# Patient Record
Sex: Female | Born: 1980 | Race: Black or African American | Hispanic: No | Marital: Single | Smoking: Never smoker
Health system: Southern US, Community
[De-identification: ages and names within clinical notes are randomized; demographics above are authoritative.]

## PROBLEM LIST (undated history)

## (undated) DIAGNOSIS — K219 Gastro-esophageal reflux disease without esophagitis: Secondary | ICD-10-CM

## (undated) DIAGNOSIS — J45909 Unspecified asthma, uncomplicated: Secondary | ICD-10-CM

## (undated) HISTORY — DX: Gastro-esophageal reflux disease without esophagitis: K21.9

---

## 2012-02-05 ENCOUNTER — Ambulatory Visit: Payer: BC Managed Care – PPO | Admitting: Family Medicine

## 2012-02-05 VITALS — BP 120/78 | HR 82 | Temp 98.4°F | Resp 16 | Ht 68.5 in | Wt 191.8 lb

## 2012-02-05 DIAGNOSIS — L218 Other seborrheic dermatitis: Secondary | ICD-10-CM

## 2012-02-05 DIAGNOSIS — B368 Other specified superficial mycoses: Secondary | ICD-10-CM

## 2012-02-05 DIAGNOSIS — B360 Pityriasis versicolor: Secondary | ICD-10-CM

## 2012-02-05 DIAGNOSIS — L219 Seborrheic dermatitis, unspecified: Secondary | ICD-10-CM

## 2012-02-05 MED ORDER — SELENIUM SULFIDE 2.5 % EX LOTN: TOPICAL_LOTION | Freq: Every day | CUTANEOUS | Status: DC

## 2012-02-05 MED ORDER — FLUCONAZOLE 200 MG PO TABS: 400.0000 mg | ORAL_TABLET | Freq: Once | ORAL | Status: DC

## 2012-02-05 NOTE — Patient Instructions (Addendum)
Tinea Versicolor  Tinea versicolor is a common yeast infection of the skin. This condition becomes known when the yeast on our skin starts to overgrow (yeast is a normal inhabitant on our skin). This condition is noticed as white or light brown patches on brown skin, and is more evident in the summer on tanned skin. These areas are slightly scaly if scratched. The light patches from the yeast become evident when the yeast creates "holes in your suntan". This is most often noticed in the summer. The patches are usually located on the chest, back, pubis, neck and body folds. However, it may occur on any area of body. Mild itching and inflammation (redness or soreness) may be present.  DIAGNOSIS   The diagnosisof this is made clinically (by looking). Cultures from samples are usually not needed. Examination under the microscope may help. However, yeast is normally found on skin. The diagnosis still remains clinical. Examination under Wood's Ultraviolet Light can determine the extent of the infection.  TREATMENT   This common infection is usually only of cosmetic (only a concern to your appearance). It is easily treated with dandruff shampoo used during showers or bathing. Vigorous scrubbing will eliminate the yeast over several days time. The light areas in your skin may remain for weeks or months after the infection is cured unless your skin is exposed to sunlight. The lighter or darker spots caused by the fungus that remain after complete treatment are not a sign of treatment failure; it will take a long time to resolve. Your caregiver may recommend a number of commercial preparations or medication by mouth if home care is not working. Recurrence is common and preventative medication may be necessary.  This skin condition is not highly contagious. Special care is not needed to protect close friends and family members. Normal hygiene is usually enough. Follow up is required only if you develop complications (such as a  secondary infection from scratching), if recommended by your caregiver, or if no relief is obtained from the preparations used.  Document Released: 01/04/2000 Document Revised: 03/31/2011 Document Reviewed: 02/16/2008  ExitCare Patient Information 2013 [employer].

## 2012-02-05 NOTE — Progress Notes (Signed)
  Subjective:    Patient ID: Holly Kemp, female    DOB: 09-23-1980, 32 y.o.   MRN: [id number]  HPI  Feels like she can feel things crawling all over her and is itching everywhere.  She tried otc lice shampoo last night but woke up with similar sxs.  The itching started a wk ago on her head and worst there.  Did see white spots in head yest on hair and near roots.  Did get new sheets, had an air mattress, new pillow, clothes and towel that were unused for a while.  Does not have any lesions other than some light patches on back.  Past Medical History  Diagnosis Date  . GERD (gastroesophageal reflux disease)    No current outpatient prescriptions on file prior to visit.   No Known Allergies History   Social History  . Marital Status: Single    Spouse Name: N/A    Number of Children: N/A  . Years of Education: N/A   Social History Main Topics  . Smoking status: Never Smoker   . Smokeless tobacco: None  . Alcohol Use: No  . Drug Use: No  . Sexually Active: No   Other Topics Concern  . None   Social History Narrative  . None  lives a lone    Review of Systems  Constitutional: Negative for fever and chills.  HENT: Negative for facial swelling and trouble swallowing.   Eyes: Negative for discharge, redness and itching.  Skin: Positive for color change and rash. Negative for pallor and wound.       Scalp and entire body pruritic  Hematological: Negative for adenopathy. Does not bruise/bleed easily.  Psychiatric/Behavioral: Positive for sleep disturbance.      BP 120/78  Pulse 82  Temp 98.4 F (36.9 C) (Oral)  Resp 16  Ht 5' 8.5" (1.74 m)  Wt 191 lb 12.8 oz (87 kg)  BMI 28.74 kg/m2  SpO2 95%  LMP 12/06/2011 Objective:   Physical Exam  Constitutional: She is oriented to person, place, and time. She appears well-developed and well-nourished. No distress.  HENT:  Head: Normocephalic and atraumatic.  Right Ear: External ear normal.  Eyes: Conjunctivae normal are  normal. No scleral icterus.  Pulmonary/Chest: Effort normal.  Neurological: She is alert and oriented to person, place, and time.  Skin: Skin is warm and dry. Rash noted. Rash is macular. She is not diaphoretic. No erythema.       Small amount of dandruff with small white flakes at base of hair noted. In center of back diffuse hypopigmented patches noted.  Psychiatric: She has a normal mood and affect. Her behavior is normal.      Assessment & Plan:   1. Seborrheic dermatitis of scalp  selenium sulfide (SELSUN) 2.5 % shampoo - wash with daily x  1 wk all over body and hair  2. Tinea versicolor  fluconazole (DIFLUCAN) 400 MG tablet po x 1  RTC if sxs cont

## 2012-02-17 ENCOUNTER — Ambulatory Visit: Payer: BC Managed Care – PPO | Admitting: Family Medicine

## 2012-02-17 ENCOUNTER — Other Ambulatory Visit: Payer: Self-pay

## 2012-02-17 VITALS — BP 115/75 | HR 78 | Temp 98.0°F | Resp 18 | Ht 68.5 in | Wt 190.6 lb

## 2012-02-17 DIAGNOSIS — L299 Pruritus, unspecified: Secondary | ICD-10-CM

## 2012-02-17 DIAGNOSIS — L309 Dermatitis, unspecified: Secondary | ICD-10-CM

## 2012-02-17 DIAGNOSIS — L0291 Cutaneous abscess, unspecified: Secondary | ICD-10-CM

## 2012-02-17 MED ORDER — LORATADINE 10 MG PO TABS: 10.0000 mg | ORAL_TABLET | Freq: Every day | ORAL | Status: DC

## 2012-02-17 MED ORDER — TRIAMCINOLONE ACETONIDE 0.025 % EX LOTN: TOPICAL_LOTION | CUTANEOUS | Status: DC

## 2012-02-17 NOTE — Patient Instructions (Addendum)
Use a tiny bit of the triamcinolone lotion once daily on the forehead and cheeks. Do not use it for more than 5 days consecutively, then he is at least a week off before trying it again if needed. Not long-term use.  Take OTC generic Claritin (loratadine) one daily  We will refer you to a dermatologist.

## 2012-02-17 NOTE — Progress Notes (Signed)
Subjective: Patient was here 2 weeks ago with itching of the scalp. Please refer to that note.  Patient has been having itching of her face and forehead now for a while. It bothering her a lot this morning. She tried to call the dermatologist but it would be a long time before she had. She does not have a choice of dermatologist. The scalp seems to be improving.  Objective: Slightly the papular skin on for his that she is. No major erythema. She does have some a number, nodes. She denies rashes being elsewhere are right now. She does not recall any great change in topical or soaps or laundry detergents it might of caused this.  Assessment: Itching on face Eczematoid dermatitis  Plan: Low potency cortisone-type medications to face, 4 very limited period of time  Take an antihistamine daily  Return if worse. Will start the process for referral to a dermatologist in case she is not doing a lot better.  See history

## 2015-08-14 ENCOUNTER — Emergency Department (HOSPITAL_COMMUNITY): Payer: Commercial Managed Care - HMO

## 2015-08-14 ENCOUNTER — Emergency Department (HOSPITAL_COMMUNITY)
Admission: EM | Admit: 2015-08-14 | Discharge: 2015-08-14 | Disposition: A | Discharge status: Home / Self Care | Payer: Commercial Managed Care - HMO | Attending: Emergency Medicine | Admitting: Emergency Medicine

## 2015-08-14 ENCOUNTER — Ambulatory Visit (INDEPENDENT_AMBULATORY_CARE_PROVIDER_SITE_OTHER): Payer: Commercial Managed Care - HMO | Admitting: Physician Assistant

## 2015-08-14 ENCOUNTER — Encounter (HOSPITAL_COMMUNITY): Payer: Self-pay | Admitting: Emergency Medicine

## 2015-08-14 VITALS — BP 122/80 | HR 82 | Temp 98.1°F | Resp 16 | Ht 68.5 in | Wt 231.8 lb

## 2015-08-14 DIAGNOSIS — R079 Chest pain, unspecified: Secondary | ICD-10-CM

## 2015-08-14 DIAGNOSIS — R112 Nausea with vomiting, unspecified: Secondary | ICD-10-CM

## 2015-08-14 DIAGNOSIS — R42 Dizziness and giddiness: Secondary | ICD-10-CM

## 2015-08-14 LAB — CBC
HCT: 37.9 % (ref 36.0–46.0)
Hemoglobin: 12.2 g/dL (ref 12.0–15.0)
MCH: 25.4 pg — ABNORMAL LOW (ref 26.0–34.0)
MCHC: 32.2 g/dL (ref 30.0–36.0)
MCV: 79 fL (ref 78.0–100.0)
Platelets: 288 10*3/uL (ref 150–400)
RBC: 4.8 MIL/uL (ref 3.87–5.11)
RDW: 13.8 % (ref 11.5–15.5)
WBC: 16.6 10*3/uL — ABNORMAL HIGH (ref 4.0–10.5)

## 2015-08-14 LAB — BASIC METABOLIC PANEL
Anion gap: 7 (ref 5–15)
BUN: 7 mg/dL (ref 6–20)
CO2: 24 mmol/L (ref 22–32)
Calcium: 9 mg/dL (ref 8.9–10.3)
Chloride: 105 mmol/L (ref 101–111)
Creatinine, Ser: 0.82 mg/dL (ref 0.44–1.00)
GFR calc Af Amer: >60 mL/min (ref >60)
GFR calc non Af Amer: >60 mL/min (ref >60)
Glucose, Bld: 129 mg/dL — ABNORMAL HIGH (ref 65–99)
Potassium: 3.8 mmol/L (ref 3.5–5.1)
Sodium: 136 mmol/L (ref 135–145)

## 2015-08-14 LAB — I-STAT TROPONIN, ED: Troponin i, poc: 0 ng/mL (ref 0.00–0.08)

## 2015-08-14 MED ORDER — PANTOPRAZOLE SODIUM 20 MG PO TBEC
20.0000 mg | DELAYED_RELEASE_TABLET | Freq: Two times a day (BID) | ORAL | 0 refills | Status: DC
Start: 2015-08-14 — End: 2016-05-07

## 2015-08-14 MED ORDER — PANTOPRAZOLE SODIUM 40 MG PO TBEC
40.0000 mg | DELAYED_RELEASE_TABLET | Freq: Once | ORAL | Status: AC
  Administered 2015-08-14: 40 mg via ORAL
  Filled 2015-08-14: qty 1

## 2015-08-14 NOTE — Progress Notes (Signed)
[facility] [address], [place] [address] [phone]  Date:  08/14/2015   Name:  Holly Kemp   DOB:  1980-07-28   MRN:  [id number]  PCP:  No primary care provider on file.    Chief Complaint: Fatigue (severe heartburn, vomiting, and dizzy since this morning. Per patient eat a chili cheese burger right before bed last night )   History of Present Illness:  This is a 35 y.o. female who is presenting with CP, vomiting and dizziness since waking this morning at 6 AM. She does have a history of GERD. She takes zegerid daily in the mornings. She states last night she ate a chili cheese dog right before bed which is abnormal for her. She woke this morning with what she thought was terrible heartburn. States feels similar to heartburn in the past but more intense and lasting much longer than usual. She did not take anything this morning for the pain. She has been nauseated and has vomited several times, non-bloody. Feeling light-headed. Pain in her chest is worse with laying down and better with sitting up. She denies sob, diaphoresis, radiation of pain, blurred vision. She does not have a personal or fam hx of heart disease. She is not a smoker. Does not drink alcohol or use rec drugs. No hx diabetes. No prior EKG before.  Review of Systems:  Review of Systems See HPI  There are no active problems to display for this patient.   Prior to Admission medications   Not on File    No Known Allergies  History reviewed. No pertinent surgical history.  Social History  Substance Use Topics  . Smoking status: Never Smoker  . Smokeless tobacco: Not on file  . Alcohol use No    Family History  Problem Relation Age of Onset  . Diabetes Mother   . Diabetes Maternal Grandmother     Medication list has been reviewed and updated.  Physical Examination:  Physical Exam  Constitutional: She is oriented to person, place, and time. She appears  well-developed and well-nourished. No distress.  HENT:  Head: Normocephalic and atraumatic.  Right Ear: Hearing normal.  Left Ear: Hearing normal.  Nose: Nose normal.  Mouth/Throat: Uvula is midline, oropharynx is clear and moist and mucous membranes are normal.  Eyes: Conjunctivae and lids are normal. Right eye exhibits no discharge. Left eye exhibits no discharge. No scleral icterus.  Neck: Carotid bruit is not present.  Cardiovascular: Normal rate, regular rhythm, normal heart sounds and normal pulses.   No murmur heard. Pulmonary/Chest: Effort normal and breath sounds normal. No respiratory distress. She has no wheezes. She has no rhonchi. She has no rales. She exhibits no tenderness.  Abdominal: Soft. Normal appearance. There is no tenderness.  No epigastric tenderness. Chest pain not reproducible  Musculoskeletal: Normal range of motion.  Lymphadenopathy:       Head (right side): No submental, no submandibular and no tonsillar adenopathy present.       Head (left side): No submental, no submandibular and no tonsillar adenopathy present.    She has no cervical adenopathy.  Neurological: She is alert and oriented to person, place, and time.  Skin: Skin is warm, dry and intact. No lesion and no rash noted.  Psychiatric: She has a normal mood and affect. Her speech is normal and behavior is normal. Thought content normal.   BP 122/80   Pulse 82   Temp 98.1 F (36.7 C) (Oral)  Resp 16   Ht 5' 8.5" (1.74 m)   Wt 231 lb 12.8 oz (105.1 kg)   LMP 06/14/2015   SpO2 97%   BMI 34.73 kg/m   EKG interpreted with Dr. Cleta Alberts: Mild ST elevation in II and AVF.   Assessment and Plan:  1. Chest pain, unspecified chest pain type 2. Dizziness 3. Nausea with vomiting CP, n/v, dizziness that started 6 hours ago. Feels like more intense heartburn. EKG with ST elevation in II and AVF, no EKG to compare to. Given zantac 150 mg and 30 cc GI cocktail without any change in pain 25 minutes later. IV  started, sent by EMS to ED for cardiac eval. - EKG 12-Lead    [place] Miners. Dyke Brackett, MHS [facility]  08/14/2015

## 2015-08-14 NOTE — ED Provider Notes (Signed)
[facility]-EMERGENCY DEPT Provider Note   CSN: [id number] Arrival date & time: 08/14/15  1218  First Provider Contact:  None    By signing my name below, I, Freida Busman, attest that this documentation has been prepared under the direction and in the presence of Raeford Razor, MD . Electronically Signed: Freida Busman, Scribe. 08/14/2015. 1:24 PM History   Chief Complaint Chief Complaint  Patient presents with  . Chest Pain    The history is provided by the patient. No language interpreter was used.     HPI Comments:  RAYAHNA VIEN is a 35 y.o. female who presents to the Emergency Department complaining of non-radiating, constant CP which she woke up with this AM at 0630. She describes a "gnawing" pressure in her mid/right chest. Pt reports associated mild DOE after she went for a walk this AM and nausea and vomiting. Pt reports h/o GERD and states she ate a chilli cheeseburger for dinner last night.  Pt went to urgent medical care this AM and was sent to the ED for further evaluation. Pt denies smoking and alcohol use. She states she took goody powder last night but denies frequent use of goody powder. Pt denies h/o abdominal surgeries and back pain.    Past Medical History:  Diagnosis Date  . GERD (gastroesophageal reflux disease)     There are no active problems to display for this patient.   No past surgical history on file.  OB History    No data available       Home Medications    Prior to Admission medications   Not on File    Family History Family History  Problem Relation Age of Onset  . Diabetes Mother   . Diabetes Maternal Grandmother     Social History Social History  Substance Use Topics  . Smoking status: Never Smoker  . Smokeless tobacco: Not on file  . Alcohol use No     Allergies   Review of patient's allergies indicates no known allergies.   Review of Systems Review of Systems  Constitutional: Negative for fever.  Respiratory:  Positive for shortness of breath.   Cardiovascular: Positive for chest pain.  Gastrointestinal: Positive for nausea and vomiting.  Musculoskeletal: Negative for back pain.  All other systems reviewed and are negative.  Physical Exam Updated Vital Signs BP 132/80 (BP Location: Left Arm)   Pulse 90   Temp 97.8 F (36.6 C) (Oral)   Resp 14   LMP 06/14/2015   SpO2 98%   Physical Exam  Constitutional: She is oriented to person, place, and time. She appears well-developed and well-nourished. No distress.  HENT:  Head: Normocephalic and atraumatic.  Eyes: EOM are normal.  Neck: Normal range of motion.  Cardiovascular: Normal rate, regular rhythm and normal heart sounds.   Pulmonary/Chest: Effort normal and breath sounds normal. She exhibits no tenderness.  Abdominal: Soft. She exhibits no distension. There is no tenderness.  Musculoskeletal: Normal range of motion.  Neurological: She is alert and oriented to person, place, and time.  Skin: Skin is warm and dry.  Psychiatric: She has a normal mood and affect. Judgment normal.  Nursing note and vitals reviewed.  ED Treatments / Results  DIAGNOSTIC STUDIES:  Oxygen Saturation is 98% on RA, normal by my interpretation.    COORDINATION OF CARE:  1:01 PM Discussed treatment plan with pt at bedside and pt agreed to plan.  Labs (all labs ordered are listed, but only abnormal results are displayed) Labs  Reviewed  BASIC METABOLIC PANEL - Abnormal; Notable for the following:       Result Value   Glucose, Bld 129 (*)    All other components within normal limits  CBC - Abnormal; Notable for the following:    WBC 16.6 (*)    MCH 25.4 (*)    All other components within normal limits  I-STAT TROPOININ, ED    EKG  EKG Interpretation  Date/Time:  Tuesday August 14 2015 12:38:53 EDT Ventricular Rate:  84 PR Interval:    QRS Duration: 93 QT Interval:  372 QTC Calculation: 440 R Axis:   43 Text Interpretation:  Sinus rhythm  Confirmed by Juleen China  MD, Tagen Brethauer ([id number]) on 08/14/2015 12:59:19 PM       Radiology No results found.   Dg Chest 2 View  Result Date: 08/14/2015 CLINICAL DATA:  Chest pain with dizziness and shortness of breath EXAM: CHEST  2 VIEW COMPARISON:  None. FINDINGS: Lungs are clear. Heart size and pulmonary vascularity are normal. No adenopathy. No pneumothorax. No bone lesions. IMPRESSION: No edema or consolidation. Electronically Signed   By: Bretta Bang III M.D.   On: 08/14/2015 13:58  Procedures Procedures   Medications Ordered in ED Medications - No data to display   Initial Impression / Assessment and Plan / ED Course  I have reviewed the triage vital signs and the nursing notes.  Pertinent labs & imaging results that were available during my care of the patient were reviewed by me and considered in my medical decision making (see chart for details).  Clinical Course    35 year old female with lower sternal/epigastric pain. Doubt ACS, PE, dissection or other emergent process. Her symptoms seem most consistent with GI problem. Possibly gastritis or PUD. Diet medications discussed. Outpatient follow-up for further evaluation.  Final Clinical Impressions(s) / ED Diagnoses   Final diagnoses:  Nonspecific chest pain    New Prescriptions New Prescriptions   No medications on file   I personally preformed the services scribed in my presence. The recorded information has been reviewed is accurate. Raeford Razor, MD.     Raeford Razor, MD 08/27/15 [phone]

## 2015-08-14 NOTE — Patient Instructions (Signed)
     IF you received an x-ray today, you will receive an invoice from [facility]. Please contact [facility] at [phone] with questions or concerns regarding your invoice.   IF you received labwork today, you will receive an invoice from [facility]. Please contact [facility] at [phone] with questions or concerns regarding your invoice.   Our billing staff will not be able to assist you with questions regarding bills from these companies.  You will be contacted with the lab results as soon as they are available. The fastest way to get your results is to activate your My Chart account. Instructions are located on the last page of this paperwork. If you have not heard from us regarding the results in 2 weeks, please contact this office.      

## 2015-08-14 NOTE — ED Notes (Signed)
Pt A&OX4, ambulatory at d/c with steady gait, NAD 

## 2015-08-14 NOTE — ED Triage Notes (Signed)
Per [facility] patient from Urgent Care for further evaluation of chest pain.  Patient with history of GERD states she ate a chili cheese dog last night and woke up this morning with burning chest pain, pain is worse when laying flat, is reduced by sitting up.  Received 324 aspirin, zantac, and GI cocktail at Urgent Care, received 1 SL nitro from [facility] en route.  Patient states nitro reduced pain from 5 to 3, patient now describes pain as pressure.  20g IV in right AC saline locked.  Patient alert and oriented.

## 2016-05-07 ENCOUNTER — Ambulatory Visit (INDEPENDENT_AMBULATORY_CARE_PROVIDER_SITE_OTHER): Payer: Commercial Managed Care - HMO | Admitting: Family Medicine

## 2016-05-07 VITALS — BP 124/82 | HR 79 | Temp 98.5°F | Resp 18 | Ht 68.5 in | Wt 234.0 lb

## 2016-05-07 DIAGNOSIS — K219 Gastro-esophageal reflux disease without esophagitis: Secondary | ICD-10-CM | POA: Diagnosis not present

## 2016-05-07 MED ORDER — PANTOPRAZOLE SODIUM 20 MG PO TBEC: 20.0000 mg | DELAYED_RELEASE_TABLET | Freq: Two times a day (BID) | ORAL | 0 refills | Status: DC

## 2016-05-07 NOTE — Patient Instructions (Addendum)
I have refilled the Protonix.  If your symptoms worsen or fail to improve, please come back and see [place].   Gastroesophageal Reflux Disease, Adult Normally, food travels down the esophagus and stays in the stomach to be digested. However, when a person has gastroesophageal reflux disease (GERD), food and stomach acid move back up into the esophagus. When this happens, the esophagus becomes sore and inflamed. Over time, GERD can create small holes (ulcers) in the lining of the esophagus. What are the causes? This condition is caused by a problem with the muscle between the esophagus and the stomach (lower esophageal sphincter, or LES). Normally, the LES muscle closes after food passes through the esophagus to the stomach. When the LES is weakened or abnormal, it does not close properly, and that allows food and stomach acid to go back up into the esophagus. The LES can be weakened by certain dietary substances, medicines, and medical conditions, including:  Tobacco use.  Pregnancy.  Having a hiatal hernia.  Heavy alcohol use.  Certain foods and beverages, such as coffee, chocolate, onions, and peppermint. What increases the risk? This condition is more likely to develop in:  People who have an increased body weight.  People who have connective tissue disorders.  People who use NSAID medicines. What are the signs or symptoms? Symptoms of this condition include:  Heartburn.  Difficult or painful swallowing.  The feeling of having a lump in the throat.  Abitter taste in the mouth.  Bad breath.  Having a large amount of saliva.  Having an upset or bloated stomach.  Belching.  Chest pain.  Shortness of breath or wheezing.  Ongoing (chronic) cough or a night-time cough.  Wearing away of tooth enamel.  Weight loss. Different conditions can cause chest pain. Make sure to see your health care provider if you experience chest pain. How is this diagnosed? Your health care  provider will take a medical history and perform a physical exam. To determine if you have mild or severe GERD, your health care provider may also monitor how you respond to treatment. You may also have other tests, including:  An endoscopy toexamine your stomach and esophagus with a small camera.  A test thatmeasures the acidity level in your esophagus.  A test thatmeasures how much pressure is on your esophagus.  A barium swallow or modified barium swallow to show the shape, size, and functioning of your esophagus. How is this treated? The goal of treatment is to help relieve your symptoms and to prevent complications. Treatment for this condition may vary depending on how severe your symptoms are. Your health care provider may recommend:  Changes to your diet.  Medicine.  Surgery. Follow these instructions at home: Diet   Follow a diet as recommended by your health care provider. This may involve avoiding foods and drinks such as:  Coffee and tea (with or without caffeine).  Drinks that containalcohol.  Energy drinks and sports drinks.  Carbonated drinks or sodas.  Chocolate and cocoa.  Peppermint and mint flavorings.  Garlic and onions.  Horseradish.  Spicy and acidic foods, including peppers, chili powder, curry powder, vinegar, hot sauces, and barbecue sauce.  Citrus fruit juices and citrus fruits, such as oranges, lemons, and limes.  Tomato-based foods, such as red sauce, chili, salsa, and pizza with red sauce.  Fried and fatty foods, such as donuts, french fries, potato chips, and high-fat dressings.  High-fat meats, such as hot dogs and fatty cuts of red and white  meats, such as rib eye steak, sausage, ham, and bacon.  High-fat dairy items, such as whole milk, butter, and cream cheese.  Eat small, frequent meals instead of large meals.  Avoid drinking large amounts of liquid with your meals.  Avoid eating meals during the 2-3 hours before  bedtime.  Avoid lying down right after you eat.  Do not exercise right after you eat. General instructions   Pay attention to any changes in your symptoms.  Take over-the-counter and prescription medicines only as told by your health care provider. Do not take aspirin, ibuprofen, or other NSAIDs unless your health care provider told you to do so.  Do not use any tobacco products, including cigarettes, chewing tobacco, and e-cigarettes. If you need help quitting, ask your health care provider.  Wear loose-fitting clothing. Do not wear anything tight around your waist that causes pressure on your abdomen.  Raise (elevate) the head of your bed 6 inches (15cm).  Try to reduce your stress, such as with yoga or meditation. If you need help reducing stress, ask your health care provider.  If you are overweight, reduce your weight to an amount that is healthy for you. Ask your health care provider for guidance about a safe weight loss goal.  Keep all follow-up visits as told by your health care provider. This is important. Contact a health care provider if:  You have new symptoms.  You have unexplained weight loss.  You have difficulty swallowing, or it hurts to swallow.  You have wheezing or a persistent cough.  Your symptoms do not improve with treatment.  You have a hoarse voice. Get help right away if:  You have pain in your arms, neck, jaw, teeth, or back.  You feel sweaty, dizzy, or light-headed.  You have chest pain or shortness of breath.  You vomit and your vomit looks like blood or coffee grounds.  You faint.  Your stool is bloody or black.  You cannot swallow, drink, or eat. This information is not intended to replace advice given to you by your health care provider. Make sure you discuss any questions you have with your health care provider. Document Released: 10/16/2004 Document Revised: 06/06/2015 Document Reviewed: 05/03/2014 Elsevier Interactive Patient  Education  2017 [employer].     IF you received an x-ray today, you will receive an invoice from [facility]. Please contact [facility] at [phone] with questions or concerns regarding your invoice.   IF you received labwork today, you will receive an invoice from [facility]. Please contact [facility] at [phone] with questions or concerns regarding your invoice.   Our billing staff will not be able to assist you with questions regarding bills from these companies.  You will be contacted with the lab results as soon as they are available. The fastest way to get your results is to activate your My Chart account. Instructions are located on the last page of this paperwork. If you have not heard from [place] regarding the results in 2 weeks, please contact this office.

## 2016-05-07 NOTE — Progress Notes (Signed)
   Holly Kemp is a 36 y.o. female who presents to Primary Care at [facility] today for concerns of GERD.   1.  GERD: patient having acid taste in her mouth a few weeks ago. patient taking Zegrid which is helping some, but doesn't resolve her symptoms. Intermittently noticing centrally located chest pain that is burning in nature in association with the foul taste. No nausea of vomiting.  Notices her symptoms the most during the daytime after eating.  She tries to avoid foods that worsen her symptoms like bananas, broccoli. No alcohol or tobacco use. She does drink caffeine. She tries to stay upright after eating. Tries to be careful with what she eats prior to bedtime. Notes she was seen here in 07/2015 for chest pain, nausea, and vomiting and was sent to the ED for evaluation- at that time they noted she either had gastritis vs PUD. 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 No hematemesis, hemoptysis, diarrhea, constipation. No sick contacts. No recent travel. No weight loss.   ROS as above.  Pertinently, no chest pain, palpitations, SOB, Fever, Chills, Abd pain, N/V/D  PMH reviewed. Patient is a nonsmoker.   Past Medical History:  Diagnosis Date  . GERD (gastroesophageal reflux disease)    No past surgical history on file.  Medications reviewed. Current Outpatient Prescriptions  Medication Sig Dispense Refill  . Omeprazole-Sodium Bicarbonate (ZEGERID OTC PO) Take by mouth.    . pantoprazole (PROTONIX) 20 MG tablet Take 1 tablet (20 mg total) by mouth 2 (two) times daily. 60 tablet 0   No current facility-administered medications for this visit.      Physical Exam:  BP 124/82   Pulse 79   Temp 98.5 F (36.9 C) (Oral)   Resp 18   Ht 5' 8.5" (1.74 m)   Wt 234 lb (106.1 kg)   SpO2 98%   BMI 35.06 kg/m  Gen:  Alert, cooperative patient who appears stated age in no acute distress.  Vital signs reviewed. HEENT: EOMI,  MMM Pulm:  Clear to  auscultation bilaterally with good air movement.  No wheezes or rales noted.   Cardiac:  Regular rate and rhythm without murmur auscultated.  Good S1/S2. Abd:  Soft/nondistended/nontender.  Good bowel sounds throughout all four quadrants.  No masses noted.  Exts: Non edematous BL  LE, warm and well perfused.   Assessment and Plan:  1.  GERD: history more consistent with GERD than PUD, as her symptoms worsen with eating instead of improving. No red flags on exam or history. Provided a list of foods to limit such as caffeine. Rx for Protonix. Discussed strict return precautions.  Joanna Puff, MD [facility] Resident  05/07/2016, 9:05 AM

## 2019-03-09 ENCOUNTER — Other Ambulatory Visit: Payer: Self-pay

## 2019-03-09 ENCOUNTER — Emergency Department (HOSPITAL_COMMUNITY)
Admission: EM | Admit: 2019-03-09 | Discharge: 2019-03-09 | Disposition: A | Discharge status: Home / Self Care | Payer: 59 | Attending: Emergency Medicine | Admitting: Emergency Medicine

## 2019-03-09 ENCOUNTER — Emergency Department (HOSPITAL_COMMUNITY): Payer: 59

## 2019-03-09 DIAGNOSIS — Z79899 Other long term (current) drug therapy: Secondary | ICD-10-CM | POA: Insufficient documentation

## 2019-03-09 DIAGNOSIS — U071 COVID-19: Secondary | ICD-10-CM | POA: Diagnosis not present

## 2019-03-09 DIAGNOSIS — R0602 Shortness of breath: Secondary | ICD-10-CM | POA: Diagnosis present

## 2019-03-09 LAB — I-STAT CHEM 8, ED
BUN: <3 mg/dL — ABNORMAL LOW (ref 6–20)
Calcium, Ion: 1.12 mmol/L — ABNORMAL LOW (ref 1.15–1.40)
Chloride: 102 mmol/L (ref 98–111)
Creatinine, Ser: 0.7 mg/dL (ref 0.44–1.00)
Glucose, Bld: 103 mg/dL — ABNORMAL HIGH (ref 70–99)
HCT: 40 % (ref 36.0–46.0)
Hemoglobin: 13.6 g/dL (ref 12.0–15.0)
Potassium: 3.2 mmol/L — ABNORMAL LOW (ref 3.5–5.1)
Sodium: 145 mmol/L (ref 135–145)
TCO2: 30 mmol/L (ref 22–32)

## 2019-03-09 LAB — I-STAT BETA HCG BLOOD, ED (MC, WL, AP ONLY): I-stat hCG, quantitative: <5 m[IU]/mL (ref <5)

## 2019-03-09 LAB — D-DIMER, QUANTITATIVE (NOT AT ARMC): D-Dimer, Quant: 0.56 ug/mL-FEU — ABNORMAL HIGH (ref 0.00–0.50)

## 2019-03-09 MED ORDER — ALBUTEROL SULFATE HFA 108 (90 BASE) MCG/ACT IN AERS: 2.0000 | INHALATION_SPRAY | Freq: Four times a day (QID) | RESPIRATORY_TRACT | 0 refills | Status: DC | PRN

## 2019-03-09 MED ORDER — IOHEXOL 350 MG/ML SOLN
80.0000 mL | Freq: Once | INTRAVENOUS | Status: AC | PRN
  Administered 2019-03-09: 18:00:00 80 mL via INTRAVENOUS

## 2019-03-09 NOTE — ED Notes (Signed)
Pt transported to CT ?

## 2019-03-09 NOTE — Discharge Instructions (Signed)
Please follow up with your primary care provider within 5-7 days for re-evaluation of your symptoms. If you do not have a primary care provider, information for a healthcare clinic has been provided for you to make arrangements for follow up care. Please return to the emergency department for any new or worsening symptoms. ° °

## 2019-03-09 NOTE — ED Provider Notes (Signed)
[facility] EMERGENCY DEPARTMENT Provider Note   CSN: [id number] Arrival date & time: 03/09/19  1026     History Chief Complaint  Patient presents with  . Shortness of Breath    Holly Kemp is a 39 y.o. female.  HPI   46-year-old female presenting for evaluation of shortness of breath and cough.  She started becoming symptomatic of Covid 02/23/2019.  She was diagnosed with Covid on 2/6.  She has a continued cough, fatigue and some dyspnea when walking up the stairs at her apartment.  She states that she did have this symptom prior to having Covid and she is not sure if it is worse today.  She denies any chest pain or pain with inspiration. Denies leg pain/swelling, hemoptysis, recent surgery/trauma, recent long travel, hormone use, personal hx of cancer, or hx of DVT/PE.   She states she had an appointment with her PCP who stated that if her symptoms worsen that she would need to be evaluated in the ED for a possible blood clot.  She states she became nervous so wanted to get evaluated for this.  Past Medical History:  Diagnosis Date  . GERD (gastroesophageal reflux disease)     There are no problems to display for this patient.   No past surgical history on file.   OB History   No obstetric history on file.     Family History  Problem Relation Age of Onset  . Diabetes Mother   . Diabetes Maternal Grandmother     Social History   Tobacco Use  . Smoking status: Never Smoker  . Smokeless tobacco: Never Used  Substance Use Topics  . Alcohol use: No  . Drug use: No    Home Medications Prior to Admission medications   Medication Sig Start Date End Date Taking? Authorizing Provider  albuterol (VENTOLIN HFA) 108 (90 Base) MCG/ACT inhaler Inhale 2 puffs into the lungs every 6 (six) hours as needed for wheezing or shortness of breath. 03/09/19   Jules Baty S, PA-C  Omeprazole-Sodium Bicarbonate (ZEGERID OTC PO) Take by mouth.    [provider]  pantoprazole (PROTONIX) 20 MG tablet Take 1 tablet (20 mg total) by mouth 2 (two) times daily. 05/07/16   Joanna Puff, MD    Allergies    Patient has no known allergies.  Review of Systems   Review of Systems  Constitutional: Positive for fatigue. Negative for chills and fever.  HENT: Negative for ear pain and sore throat.   Eyes: Negative for pain and visual disturbance.  Respiratory: Positive for cough and shortness of breath.   Cardiovascular: Negative for chest pain, palpitations and leg swelling.  Gastrointestinal: Negative for abdominal pain, constipation, diarrhea, nausea and vomiting.  Genitourinary: Negative for dysuria and hematuria.  Musculoskeletal: Negative for back pain.  Skin: Negative for rash.  Neurological: Negative for headaches.  All other systems reviewed and are negative.   Physical Exam Updated Vital Signs BP 120/69   Pulse (!) 106   Temp 97.8 F (36.6 C) (Oral)   Resp 18   Ht 5\' 8"  (1.727 m)   Wt 88.5 kg   SpO2 98%   BMI 29.65 kg/m   Physical Exam Vitals and nursing note reviewed.  Constitutional:      General: She is not in acute distress.    Appearance: She is well-developed.  HENT:     Head: Normocephalic and atraumatic.     Mouth/Throat:     Mouth: Mucous  membranes are dry.  Eyes:     Conjunctiva/sclera: Conjunctivae normal.  Cardiovascular:     Rate and Rhythm: Regular rhythm. Tachycardia present.     Heart sounds: No murmur.     Comments: HR 97-112 on monitor Pulmonary:     Effort: Pulmonary effort is normal. No respiratory distress.     Breath sounds: Normal breath sounds. No decreased breath sounds, wheezing, rhonchi or rales.  Abdominal:     General: Bowel sounds are normal.     Palpations: Abdomen is soft.     Tenderness: There is no abdominal tenderness.  Musculoskeletal:     Cervical back: Neck supple.     Right lower leg: No tenderness. No edema.     Left lower leg: No tenderness. No edema.  Skin:     General: Skin is warm and dry.  Neurological:     Mental Status: She is alert.     ED Results / Procedures / Treatments   Labs (all labs ordered are listed, but only abnormal results are displayed) Labs Reviewed  D-DIMER, QUANTITATIVE (NOT AT [facility]) - Abnormal; Notable for the following components:      Result Value   D-Dimer, Quant 0.56 (*)    All other components within normal limits  I-STAT CHEM 8, ED - Abnormal; Notable for the following components:   Potassium 3.2 (*)    BUN <3 (*)    Glucose, Bld 103 (*)    Calcium, Ion 1.12 (*)    All other components within normal limits  I-STAT BETA HCG BLOOD, ED ([facility], [facility], [facility] ONLY)    EKG None  Radiology CT Angio Chest PE W and/or Wo Contrast  Result Date: 03/09/2019 CLINICAL DATA:  39 year old female with positive COVID-19. Evaluate for pulmonary embolism. EXAM: CT ANGIOGRAPHY CHEST WITH CONTRAST TECHNIQUE: Multidetector CT imaging of the chest was performed using the standard protocol during bolus administration of intravenous contrast. Multiplanar CT image reconstructions and MIPs were obtained to evaluate the vascular anatomy. CONTRAST:  76mL OMNIPAQUE IOHEXOL 350 MG/ML SOLN COMPARISON:  Chest radiograph dated 03/09/2019. FINDINGS: Cardiovascular: There is no cardiomegaly or pericardial effusion. The thoracic aorta is unremarkable. The origins of the great vessels of the aortic arch appear patent. Evaluation of the pulmonary arteries is somewhat limited due to respiratory motion artifact. No definite pulmonary artery embolus identified. Mediastinum/Nodes: Mildly enlarged left hilar lymph nodes. The esophagus is grossly unremarkable. No mediastinal fluid collection. Lungs/Pleura: Bilateral predominantly subpleural and peripheral clusters of ground-glass opacities most consistent with pneumonia, likely viral or atypical in etiology and in keeping with known COVID-19. There is no pleural effusion or pneumothorax. The central airways are patent.  Upper Abdomen: Probable fatty liver. Musculoskeletal: No chest wall abnormality. No acute or significant osseous findings. Review of the MIP images confirms the above findings. IMPRESSION: 1. No CT evidence of pulmonary embolism. 2. Multifocal pneumonia in keeping with known COVID-19. Clinical correlation and follow-up recommended. Electronically Signed   By: Anner Crete M.D.   On: 03/09/2019 18:27   DG Chest Portable 1 View  Result Date: 03/09/2019 CLINICAL DATA:  Cough.  COVID-19 positive EXAM: PORTABLE CHEST 1 VIEW COMPARISON:  August 14, 2015 FINDINGS: There is ill-defined opacity in portions of the left mid and lower lung regions. Lungs elsewhere clear. Heart size and pulmonary vascularity are normal. No adenopathy. No bone lesions IMPRESSION: Ill-defined areas of opacity in portions of the left mid and lower lung zone, likely representing foci of atypical organism pneumonia. Lungs elsewhere clear. Cardiac  silhouette normal. No adenopathy. Electronically Signed   By: Bretta Bang III M.D.   On: 03/09/2019 12:00    Procedures Procedures (including critical care time)  Medications Ordered in ED Medications  iohexol (OMNIPAQUE) 350 MG/ML injection 80 mL (80 mLs Intravenous Contrast Given 03/09/19 1747)    ED Course  I have reviewed the triage vital signs and the nursing notes.  Pertinent labs & imaging results that were available during my care of the patient were reviewed by me and considered in my medical decision making (see chart for details).    MDM Rules/Calculators/A&P                      39 year old female presenting for evaluation of ongoing shortness of breath after being diagnosed with Covid earlier this month on 2/6.  She became symptomatic on 2/3.  She reports exertional dyspnea when walking up several flights of stairs to get to her apartment.  She did have this prior to Covid and is not sure if it is worse but is concerned about her symptoms so came to the ED.  She  denies any chest pain or exertional chest pain.  No pleuritic pain.  Reviewed and interpreted w/u EKG with NSR, HR 97 CXR personally reviewed and showed opacities in the left mid and lower lung zones Chem 8 with normal renal function. Slightly low potassium. Normal hgb ddimer slightly elevated, will get CTA chest  CTA chest reviewed and I agree with radiologist that there is no evidence of PE but there is evidence of multifocal pneumonia consistent with COVID 19.   Pt ambulated in the ED with pulse ox and remained at 99% on RA. Her HR is mildly elevated today but she does appear dry on exam. She was provided with PO fluids and HR improved.    W/u reassuring. sxs likely from lingering COVID. Will give albuterol inhaler for sxs. Advised close pcp f/u and strict return precautions. She voices understanding of the plan and reasons to return.  All questions answered.  Patient stable for discharge.  ---  Cletis Media was evaluated in Emergency Department on 03/09/2019 for the symptoms described in the history of present illness. She was evaluated in the context of the global COVID-19 pandemic, which necessitated consideration that the patient might be at risk for infection with the SARS-CoV-2 virus that causes COVID-19. Institutional protocols and algorithms that pertain to the evaluation of patients at risk for COVID-19 are in a state of rapid change based on information released by regulatory bodies including the CDC and federal and state organizations. These policies and algorithms were followed during the patient's care in the ED.    Final Clinical Impression(s) / ED Diagnoses Final diagnoses:  COVID-19    Rx / DC Orders ED Discharge Orders         Ordered    albuterol (VENTOLIN HFA) 108 (90 Base) MCG/ACT inhaler  Every 6 hours PRN     03/09/19 1833           Karrie Meres, PA-C 03/09/19 1835    Lorre Nick, MD 03/10/19 [phone]

## 2019-03-09 NOTE — ED Triage Notes (Signed)
Pt dx with covid on 2/6. Discussed with PCP about going back to work but pt is concerned because she has shortness of breath after climbing three flights of stairs at her apartment. Sts she also had shob when climbing these stairs prior to covid dx. No shob in daily activities or at rest. Denies chest pain, endorses lingering cough.

## 2019-03-09 NOTE — ED Notes (Signed)
Walking Sa02 is 99% on RA

## 2020-12-11 IMAGING — DX DG CHEST 1V PORT
2 series · 2 of 2 positions shown · non-contrast
Comparison: August 14, 2015

CLINICAL DATA: Cough.  0THHK-NU positive

EXAM:
PORTABLE CHEST 1 VIEW

[chest (1 of 2)]
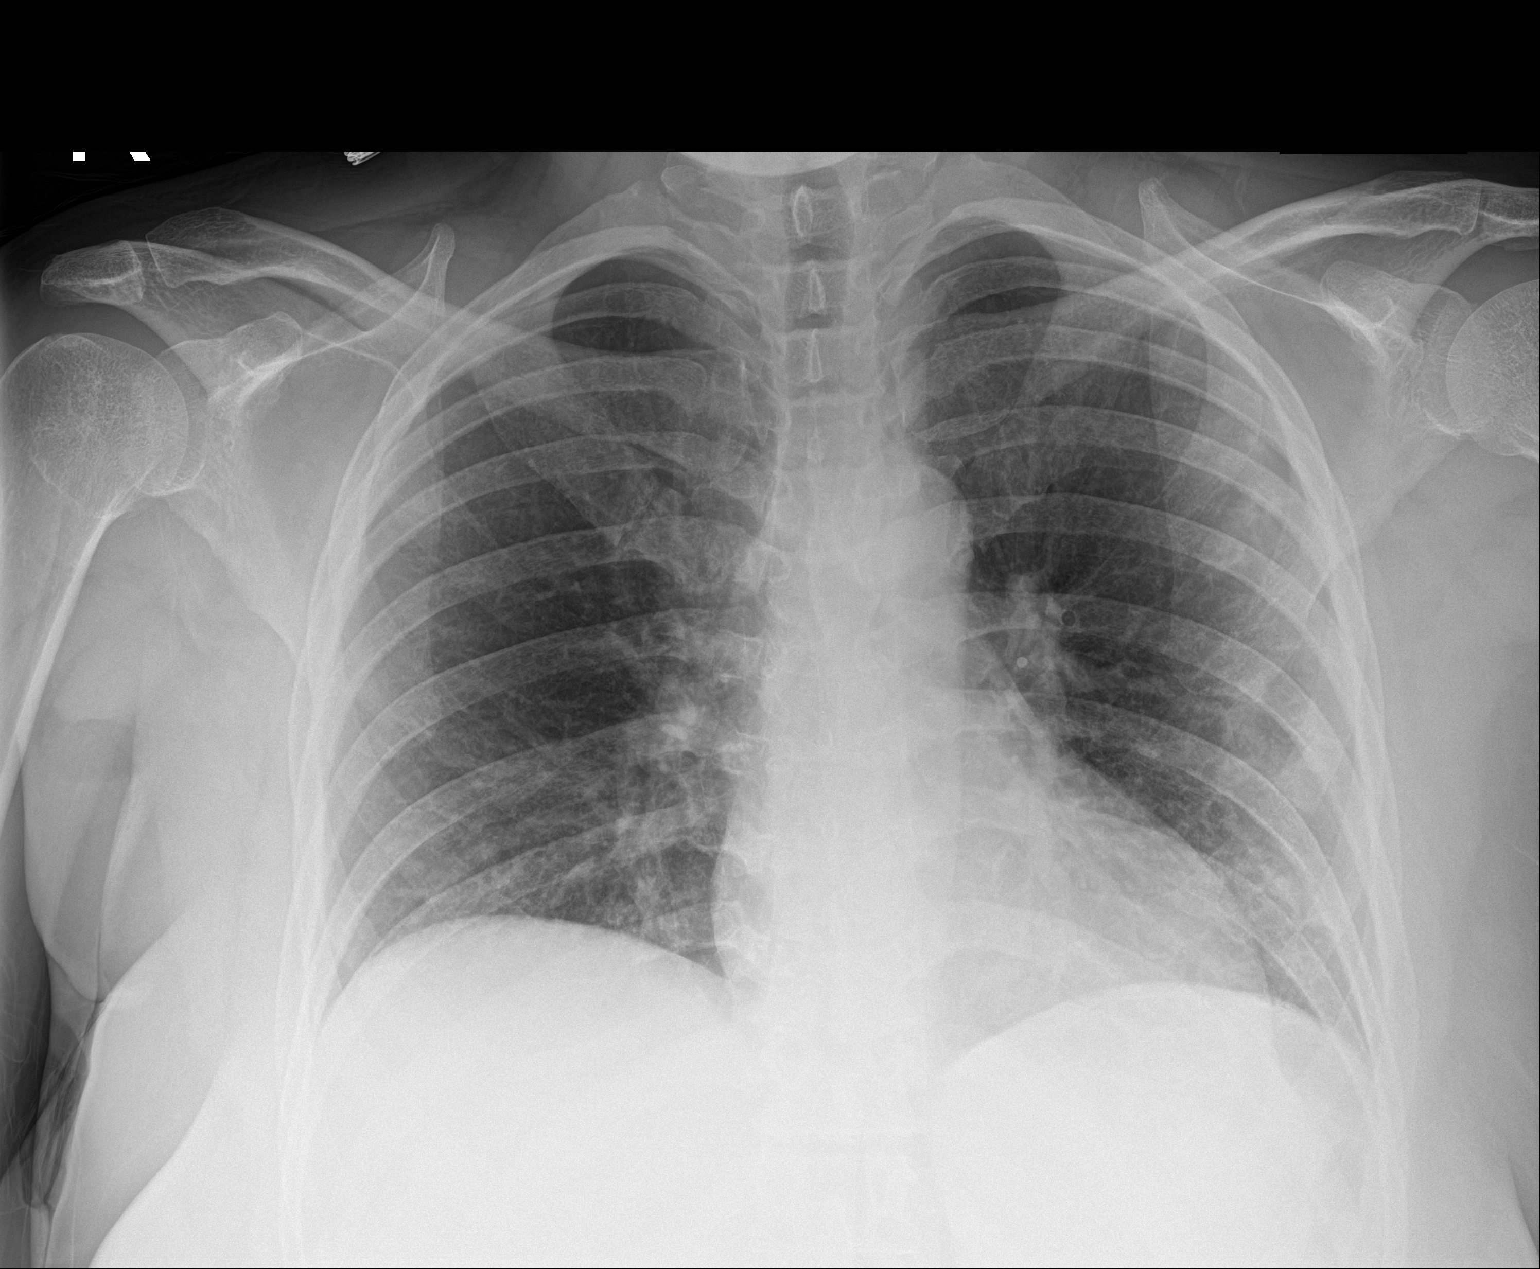

[chest (2 of 2)]
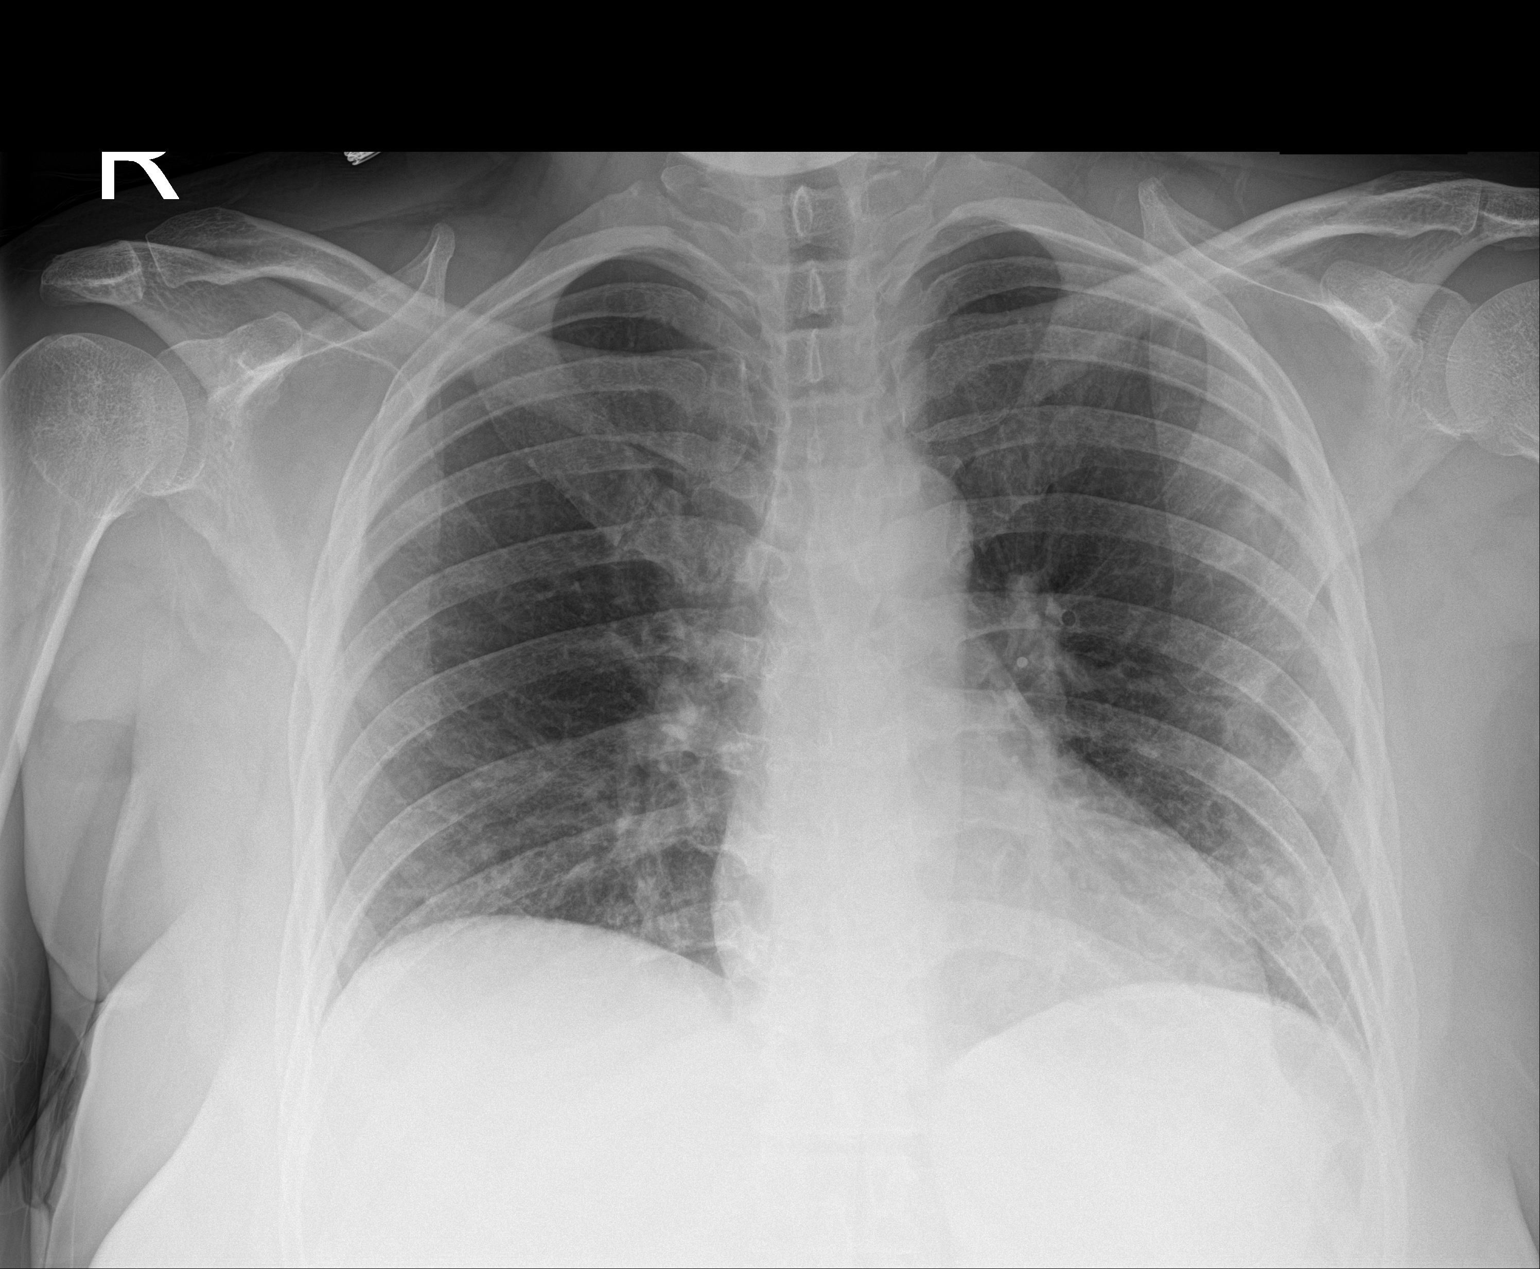

[2 of 2 positions shown; findings below may reference images not displayed]

FINDINGS: There is ill-defined opacity in portions of the left mid and lower
lung regions. Lungs elsewhere clear. Heart size and pulmonary
vascularity are normal. No adenopathy. No bone lesions
IMPRESSION: Ill-defined areas of opacity in portions of the left mid and lower
lung zone, likely representing foci of atypical organism pneumonia.
Lungs elsewhere clear. Cardiac silhouette normal. No adenopathy.

## 2021-07-03 ENCOUNTER — Ambulatory Visit: Payer: 59 | Admitting: Nurse Practitioner

## 2021-07-03 ENCOUNTER — Telehealth: Payer: Self-pay | Admitting: General Practice

## 2021-07-03 NOTE — Telephone Encounter (Signed)
Pt was a no show for a NP app on 07/03/21 with Leotis Shames, I sent a no show letter.

## 2021-08-12 NOTE — Telephone Encounter (Signed)
1st no show, fee waived ?

## 2023-06-26 ENCOUNTER — Emergency Department (HOSPITAL_COMMUNITY): Admission: EM | Admit: 2023-06-26 | Discharge: 2023-06-26 | Disposition: A | Discharge status: Home / Self Care

## 2023-06-26 ENCOUNTER — Other Ambulatory Visit: Payer: Self-pay

## 2023-06-26 ENCOUNTER — Emergency Department (HOSPITAL_COMMUNITY)

## 2023-06-26 DIAGNOSIS — R0609 Other forms of dyspnea: Secondary | ICD-10-CM | POA: Diagnosis not present

## 2023-06-26 DIAGNOSIS — R059 Cough, unspecified: Secondary | ICD-10-CM | POA: Diagnosis present

## 2023-06-26 DIAGNOSIS — R053 Chronic cough: Secondary | ICD-10-CM | POA: Diagnosis not present

## 2023-06-26 DIAGNOSIS — R000 Tachycardia, unspecified: Secondary | ICD-10-CM | POA: Diagnosis not present

## 2023-06-26 DIAGNOSIS — D72829 Elevated white blood cell count, unspecified: Secondary | ICD-10-CM | POA: Insufficient documentation

## 2023-06-26 LAB — I-STAT CG4 LACTIC ACID, ED: Lactic Acid, Venous: 0.9 mmol/L (ref 0.5–1.9)

## 2023-06-26 LAB — BASIC METABOLIC PANEL WITH GFR
Anion gap: 11 (ref 5–15)
BUN: 8 mg/dL (ref 6–20)
CO2: 22 mmol/L (ref 22–32)
Calcium: 9.1 mg/dL (ref 8.9–10.3)
Chloride: 103 mmol/L (ref 98–111)
Creatinine, Ser: 0.81 mg/dL (ref 0.44–1.00)
GFR, Estimated: >60 mL/min (ref >60)
Glucose, Bld: 118 mg/dL — ABNORMAL HIGH (ref 70–99)
Potassium: 3.3 mmol/L — ABNORMAL LOW (ref 3.5–5.1)
Sodium: 136 mmol/L (ref 135–145)

## 2023-06-26 LAB — URINALYSIS, ROUTINE W REFLEX MICROSCOPIC
Bacteria, UA: NONE SEEN
Bilirubin Urine: NEGATIVE
Glucose, UA: NEGATIVE mg/dL
Ketones, ur: 20 mg/dL — AB
Leukocytes,Ua: NEGATIVE
Nitrite: NEGATIVE
Protein, ur: NEGATIVE mg/dL
Specific Gravity, Urine: 1.021 (ref 1.005–1.030)
pH: 6 (ref 5.0–8.0)

## 2023-06-26 LAB — BRAIN NATRIURETIC PEPTIDE: B Natriuretic Peptide: 40 pg/mL (ref 0.0–100.0)

## 2023-06-26 LAB — CBC
HCT: 40.7 % (ref 36.0–46.0)
Hemoglobin: 13.2 g/dL (ref 12.0–15.0)
MCH: 25.7 pg — ABNORMAL LOW (ref 26.0–34.0)
MCHC: 32.4 g/dL (ref 30.0–36.0)
MCV: 79.2 fL — ABNORMAL LOW (ref 80.0–100.0)
Platelets: 332 10*3/uL (ref 150–400)
RBC: 5.14 MIL/uL — ABNORMAL HIGH (ref 3.87–5.11)
RDW: 14.2 % (ref 11.5–15.5)
WBC: 17 10*3/uL — ABNORMAL HIGH (ref 4.0–10.5)
nRBC: 0 % (ref 0.0–0.2)

## 2023-06-26 LAB — D-DIMER, QUANTITATIVE: D-Dimer, Quant: 0.33 ug{FEU}/mL (ref 0.00–0.50)

## 2023-06-26 LAB — RAPID URINE DRUG SCREEN, HOSP PERFORMED
Amphetamines: NOT DETECTED
Barbiturates: NOT DETECTED
Benzodiazepines: NOT DETECTED
Cocaine: NOT DETECTED
Opiates: NOT DETECTED
Tetrahydrocannabinol: NOT DETECTED

## 2023-06-26 LAB — TSH: TSH: 0.312 u[IU]/mL — ABNORMAL LOW (ref 0.350–4.500)

## 2023-06-26 LAB — PREGNANCY, URINE: Preg Test, Ur: NEGATIVE

## 2023-06-26 MED ORDER — ALPRAZOLAM 0.5 MG PO TABS
0.5000 mg | ORAL_TABLET | Freq: Once | ORAL | Status: AC
  Administered 2023-06-26: 0.5 mg via ORAL
  Filled 2023-06-26: qty 1

## 2023-06-26 MED ORDER — POTASSIUM CHLORIDE CRYS ER 20 MEQ PO TBCR
40.0000 meq | EXTENDED_RELEASE_TABLET | Freq: Once | ORAL | Status: AC
  Administered 2023-06-26: 40 meq via ORAL
  Filled 2023-06-26: qty 2

## 2023-06-26 MED ORDER — SODIUM CHLORIDE 0.9 % IV BOLUS
500.0000 mL | Freq: Once | INTRAVENOUS | Status: AC
  Administered 2023-06-26: 500 mL via INTRAVENOUS

## 2023-06-26 NOTE — ED Notes (Signed)
 TSH lab being added to previous samples sent

## 2023-06-26 NOTE — Discharge Instructions (Addendum)
 Return to the emergency department if your symptoms worsen, if you develop chest pain/shortness of breath. Because you do not have a primary care provider I will provide you the number for [place] [facility], please call this number and schedule follow-up to establish care, it is important that you follow-up with a primary care provider in regard to your symptoms today.

## 2023-06-26 NOTE — ED Provider Notes (Signed)
 [place] EMERGENCY DEPARTMENT AT [facility] Provider Note   CSN: [id number] Arrival date & time: 06/26/23  4098     History  Chief Complaint  Patient presents with   Cough    Pt arrives c/o a cough that has been ongoing for over a month that she cannot get rid of. Denies pain anywhere, but states she gets "dazed" in her head, over the last few weeks. Also endorses PND, only medical history is GERD, does not have a PCP    Holly Kemp is a 43 y.o. female.  43 year old female presenting with cough. Patient reports cough has been ongoing for "2-3 years" but tends to worsen during allergy season. She notes that when she lays down at night, specifically when she turns on her side or lays on her stomach, she will begin to cough. She reports that she is needing to sleep propped up on multiple pillows to avoid coughing.  She reports dyspnea on exertion when going on long walks stating "after 2 to 3 miles I have shortness of breath/chest pain".  She has been told her BP was high in the past but has never been on anti-hypertensives, she does not take any medications daily and is not established with a PCP. She denies fevers, recent URI symptoms, chest pain, shortness of breath, lower extremity edema, dysuria/hematuria, nausea/vomiting/diarrhea. Denies tobacco use.    Cough      Home Medications Prior to Admission medications   Medication Sig Start Date End Date Taking? Authorizing Provider  albuterol  (VENTOLIN  HFA) 108 (90 Base) MCG/ACT inhaler Inhale 2 puffs into the lungs every 6 (six) hours as needed for wheezing or shortness of breath. 03/09/19   Couture, Cortni S, PA-C  Omeprazole-Sodium Bicarbonate (ZEGERID OTC PO) Take by mouth.    [provider]  pantoprazole  (PROTONIX ) 20 MG tablet Take 1 tablet (20 mg total) by mouth 2 (two) times daily. 05/07/16   Almarie Jacob, MD      Allergies    Patient has no known allergies.    Review of Systems   Review of  Systems  Respiratory:  Positive for cough.     Physical Exam Updated Vital Signs  Vitals:   06/26/23 1025 06/26/23 1130 06/26/23 1230 06/26/23 1526  BP: (!) 159/94  (!) 147/101   Pulse: (!) 115  (!) 110   Resp: 18  (!) 21   Temp:  98.7 F (37.1 C)  98.4 F (36.9 C)  TempSrc:  Oral  Oral  SpO2: 100%  100%     Physical Exam Vitals and nursing note reviewed.  HENT:     Head: Normocephalic and atraumatic.  Eyes:     Extraocular Movements: Extraocular movements intact.  Cardiovascular:     Rate and Rhythm: Regular rhythm. Tachycardia present.     Pulses:          Radial pulses are 2+ on the right side and 2+ on the left side.     Heart sounds: Normal heart sounds.  Pulmonary:     Effort: Pulmonary effort is normal. No respiratory distress.     Breath sounds: Normal breath sounds.  Musculoskeletal:     Cervical back: Normal range of motion.     Right lower leg: No edema.     Left lower leg: No edema.     Comments: Moves all extremities spontaneously without difficulty  Skin:    General: Skin is warm and dry.  Neurological:  Mental Status: She is alert and oriented to person, place, and time.     ED Results / Procedures / Treatments   Labs (all labs ordered are listed, but only abnormal results are displayed) Labs Reviewed - No data to display  EKG None  Radiology No results found.  Procedures Procedures    Medications Ordered in ED Medications - No data to display  ED Course/ Medical Decision Making/ A&P                                 Medical Decision Making This patient presents to the ED for concern of cough, this involves an extensive number of treatment options, and is a complaint that carries with it a high risk of complications and morbidity.  The differential diagnosis includes chronic cough secondary to GERD, CHF, PE, arrhythmia, malignancy.   Co morbidities that complicate the patient evaluation  GERD   Additional history  obtained:  Additional history obtained from record review External records from outside source obtained and reviewed including previous ED/Urgent care notes   Lab Tests:  I Ordered, and personally interpreted labs.  The pertinent results include: CBC notable for leukocytosis, this is consistent with laboratory findings from 7 years ago but unfortunately I do not have more recent data for comparison.  D-dimer 0.33, this is reassuring and diagnosis of PE is unlikely at this time, no additional imaging warranted.  CMP notable for mild hypokalemia with potassium of 3.3.  BNP within normal limits.  Lactic of 0.9, low suspicion for underlying infectious process.  Urine drug screen negative.  Urinalysis notable for trace RBCs, ketones, no evidence of infection. Urine hcg negative. TSH pending at time of discharge, however I have a low suspicion for hyperthyroidism given no self-reported history or other findings suggestive of this diagnosis.    Imaging Studies ordered:  I ordered imaging studies including chest XR  I independently visualized and interpreted imaging which showed Lower lung volumes. Otherwise, no acute cardiopulmonary abnormality. I agree with the radiologist interpretation   Cardiac Monitoring: / EKG:  The patient was maintained on a cardiac monitor.  I personally viewed and interpreted the cardiac monitored which showed an underlying rhythm of: sinus tachycardia   Problem List / ED Course / Critical interventions / Medication management  500cc bolus ordered for tachycardia, may be attributed to dehydration. Additional 500cc bolus given. I ordered medication including PO potassium  for hypokalemia, alprazolam for anxiety  Reevaluation of the patient after these medicines showed that the patient improved I have reviewed the patients home medicines and have made adjustments as needed   Social Determinants of Health:  No PCP   Test / Admission - Considered:  Physical exam  is largely unremarkable, patient does not demonstrate signs of fluid overload, lungs are clear to auscultation bilaterally without adventitious sounds, there is no lower extremity edema, however given patient's complaint of chronic cough with what sounds like orthopnea/PND type symptoms, I did elect to proceed with BNP and chest x-ray to assess for evidence of volume overload.  See above for XR interpretation. Patient is tachycardic and hypertensive upon presentation to the emergency department and during my assessment, will give 500 cc fluid bolus in the event that tachycardia is attributed to dehydration.  EKG as above.  Given that patient is tachycardic, PE cannot be ruled out, despite patient not suffering from chest pain/shortness of breath and otherwise does not demonstrate risk  factors consistent with PE, I did elect to proceed with ordering a D-dimer.  Patient had elevated D-dimer in the past during COVID-19 infection in 2021, however CTA was negative for PE.  D-dimer 0.33, additional CT imaging not warranted at this time.  Patient remains hypertensive however this has improved since initial presentation, unfortunately she does continue to demonstrate tachycardia despite 500 cc fluid bolus.  Given tachycardia with white blood cell count of 17, I did initiate additional workup to include lactic acid/blood cultures/urinalysis/urine drug screen, will give additional IVF bolus.  Patient denies use of caffeine or illicit substances prior to her presentation to the emergency department today.  Infectious workup thus far is unremarkable, lactic acid within normal limits, urinalysis without leukocytes/nitrites and patient not demonstrating any urinary symptoms, blood cultures pending.  When observing the patient outside of the room, her heart rate is right around 100 bpm, however once I begin to talk to the patient her heart rate shoots up to 10 to 20 bpm.  Patient does report significant anxiety since  presenting to the emergency department, she has received Xanax which has helped with her anxiety but her tachycardia continues to persist.  Workup has been unremarkable, I do not believe the patient has underlying infectious process contributing to her continued tachycardia and leukocytosis, she is afebrile and otherwise well-appearing, patient initially presented with complaint of cough however has not been observed to be coughing since she has been here, she reports that this is a chronic condition and has been ongoing for "2 to 3 years".  I discussed patient's symptoms in depth, I do feel that she is appropriate for discharge at this time however I emphasized with the patient that it is important for her to reestablish care with a primary care provider in order to further assess her symptoms, I will provide her with the phone number to contact [facility] in order to schedule follow-up and management of her chronic conditions.  Patient voiced understanding and is in agreement with this plan.  Return precautions discussed.    Amount and/or Complexity of Data Reviewed Labs: ordered. Radiology: ordered.  Risk Prescription drug management.           Final Clinical Impression(s) / ED Diagnoses Final diagnoses:  Tachycardia  Chronic cough  Leukocytosis, unspecified type    Rx / DC Orders ED Discharge Orders     None         Kendrick Pax, PA-C 06/26/23 1530    Rolinda Climes, DO 06/26/23 1544

## 2023-06-27 ENCOUNTER — Other Ambulatory Visit: Payer: Self-pay

## 2023-06-27 ENCOUNTER — Ambulatory Visit (HOSPITAL_COMMUNITY)
Admission: EM | Admit: 2023-06-27 | Discharge: 2023-06-29 | Disposition: A | Discharge status: Home / Self Care | Attending: Psychiatry | Admitting: Psychiatry

## 2023-06-27 DIAGNOSIS — R946 Abnormal results of thyroid function studies: Secondary | ICD-10-CM | POA: Insufficient documentation

## 2023-06-27 DIAGNOSIS — F29 Unspecified psychosis not due to a substance or known physiological condition: Secondary | ICD-10-CM

## 2023-06-27 DIAGNOSIS — Z566 Other physical and mental strain related to work: Secondary | ICD-10-CM | POA: Insufficient documentation

## 2023-06-27 DIAGNOSIS — J329 Chronic sinusitis, unspecified: Secondary | ICD-10-CM | POA: Diagnosis not present

## 2023-06-27 DIAGNOSIS — F23 Brief psychotic disorder: Secondary | ICD-10-CM | POA: Diagnosis not present

## 2023-06-27 DIAGNOSIS — R443 Hallucinations, unspecified: Secondary | ICD-10-CM

## 2023-06-27 DIAGNOSIS — Z79899 Other long term (current) drug therapy: Secondary | ICD-10-CM | POA: Insufficient documentation

## 2023-06-27 DIAGNOSIS — Z62820 Parent-biological child conflict: Secondary | ICD-10-CM | POA: Diagnosis not present

## 2023-06-27 DIAGNOSIS — F22 Delusional disorders: Secondary | ICD-10-CM | POA: Diagnosis not present

## 2023-06-27 DIAGNOSIS — Z6379 Other stressful life events affecting family and household: Secondary | ICD-10-CM | POA: Insufficient documentation

## 2023-06-27 DIAGNOSIS — R7989 Other specified abnormal findings of blood chemistry: Secondary | ICD-10-CM | POA: Diagnosis not present

## 2023-06-27 DIAGNOSIS — H748X2 Other specified disorders of left middle ear and mastoid: Secondary | ICD-10-CM | POA: Diagnosis not present

## 2023-06-27 DIAGNOSIS — R440 Auditory hallucinations: Secondary | ICD-10-CM | POA: Diagnosis present

## 2023-06-27 DIAGNOSIS — F419 Anxiety disorder, unspecified: Secondary | ICD-10-CM | POA: Diagnosis not present

## 2023-06-27 DIAGNOSIS — G4700 Insomnia, unspecified: Secondary | ICD-10-CM | POA: Diagnosis not present

## 2023-06-27 LAB — CBC WITH DIFFERENTIAL/PLATELET
Abs Immature Granulocytes: 0.04 10*3/uL (ref 0.00–0.07)
Basophils Absolute: 0.1 10*3/uL (ref 0.0–0.1)
Basophils Relative: 1 %
Eosinophils Absolute: 0.1 10*3/uL (ref 0.0–0.5)
Eosinophils Relative: 1 %
HCT: 39 % (ref 36.0–46.0)
Hemoglobin: 12.4 g/dL (ref 12.0–15.0)
Immature Granulocytes: 0 %
Lymphocytes Relative: 22 %
Lymphs Abs: 2.7 10*3/uL (ref 0.7–4.0)
MCH: 25.6 pg — ABNORMAL LOW (ref 26.0–34.0)
MCHC: 31.8 g/dL (ref 30.0–36.0)
MCV: 80.6 fL (ref 80.0–100.0)
Monocytes Absolute: 1.2 10*3/uL — ABNORMAL HIGH (ref 0.1–1.0)
Monocytes Relative: 10 %
Neutro Abs: 8.2 10*3/uL — ABNORMAL HIGH (ref 1.7–7.7)
Neutrophils Relative %: 66 %
Platelets: 249 10*3/uL (ref 150–400)
RBC: 4.84 MIL/uL (ref 3.87–5.11)
RDW: 14.2 % (ref 11.5–15.5)
WBC: 12.2 10*3/uL — ABNORMAL HIGH (ref 4.0–10.5)
nRBC: 0 % (ref 0.0–0.2)

## 2023-06-27 LAB — ETHANOL: Alcohol, Ethyl (B): <15 mg/dL (ref <15)

## 2023-06-27 LAB — POTASSIUM: Potassium: 3.4 mmol/L — ABNORMAL LOW (ref 3.5–5.1)

## 2023-06-27 MED ORDER — ALUM & MAG HYDROXIDE-SIMETH 200-200-20 MG/5ML PO SUSP: 30.0000 mL | ORAL | Status: DC | PRN

## 2023-06-27 MED ORDER — RISPERIDONE 1 MG PO TABS
1.0000 mg | ORAL_TABLET | Freq: Two times a day (BID) | ORAL | Status: DC
  Administered 2023-06-27 – 2023-06-29 (×5): 1 mg via ORAL
  Filled 2023-06-27 (×5): qty 1

## 2023-06-27 MED ORDER — DIPHENHYDRAMINE HCL 50 MG PO CAPS: 50.0000 mg | ORAL_CAPSULE | Freq: Three times a day (TID) | ORAL | Status: DC | PRN

## 2023-06-27 MED ORDER — MAGNESIUM HYDROXIDE 400 MG/5ML PO SUSP: 30.0000 mL | Freq: Every day | ORAL | Status: DC | PRN

## 2023-06-27 MED ORDER — HYDROXYZINE HCL 25 MG PO TABS
25.0000 mg | ORAL_TABLET | Freq: Three times a day (TID) | ORAL | Status: DC | PRN
  Administered 2023-06-28 (×2): 25 mg via ORAL
  Filled 2023-06-27 (×2): qty 1

## 2023-06-27 MED ORDER — ACETAMINOPHEN 325 MG PO TABS: 650.0000 mg | ORAL_TABLET | Freq: Four times a day (QID) | ORAL | Status: DC | PRN

## 2023-06-27 MED ORDER — LORAZEPAM 2 MG/ML IJ SOLN: 2.0000 mg | Freq: Three times a day (TID) | INTRAMUSCULAR | Status: DC | PRN

## 2023-06-27 MED ORDER — DIPHENHYDRAMINE HCL 50 MG/ML IJ SOLN: 50.0000 mg | Freq: Three times a day (TID) | INTRAMUSCULAR | Status: DC | PRN

## 2023-06-27 MED ORDER — HALOPERIDOL LACTATE 5 MG/ML IJ SOLN: 5.0000 mg | Freq: Three times a day (TID) | INTRAMUSCULAR | Status: DC | PRN

## 2023-06-27 MED ORDER — TRAZODONE HCL 50 MG PO TABS
50.0000 mg | ORAL_TABLET | Freq: Every evening | ORAL | Status: DC | PRN
  Administered 2023-06-27 – 2023-06-28 (×2): 50 mg via ORAL
  Filled 2023-06-27 (×2): qty 1

## 2023-06-27 MED ORDER — HALOPERIDOL LACTATE 5 MG/ML IJ SOLN: 10.0000 mg | Freq: Three times a day (TID) | INTRAMUSCULAR | Status: DC | PRN

## 2023-06-27 MED ORDER — HALOPERIDOL 5 MG PO TABS: 5.0000 mg | ORAL_TABLET | Freq: Three times a day (TID) | ORAL | Status: DC | PRN
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
## 2023-06-27 NOTE — ED Notes (Signed)
 Pt A&Ox4, calm & cooperative and in NAD at this time. Denies SI/HI/AVH. Contracts for safety. Encouragement and support given. Will continue to monitor.
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
## 2023-06-27 NOTE — ED Notes (Signed)
Pt resting quietly with eyes closed.  No pain or discomfort noted/voiced.  Breathing is even and unlabored.  Will continue to monitor for safety.  

## 2023-06-27 NOTE — ED Provider Notes (Signed)
 Behavioral Health Progress Note  Date and Time: 06/27/2023 12:03 PM Name: Holly Kemp MRN:  [id number]  Subjective:  "The voices told me to get away from my mother"  Diagnosis:  Final diagnoses:  Hallucinations  Paranoia (psychosis) (HCC)  Low serum thyroid stimulating hormone (TSH)   Total Time spent with patient: 30 minutes  Patient seen today during rounds after being admitted to [facility] less than 5 hours ago. Patient is here voluntary. Patient was seen in the emergency department yesterday at [facility] where she presented with concerns for 2-3 month cough and shortness of breath. On arrival at the ED yesterday, patient was noted to have an elevated heart rate fluctuating between 100-110, TSH low at 0.312 (without hx of hyperthyroidism), elevated WBC 17, negative UA, negative D-dimer and BNP within normal limits. Patients potassium was low at 3.3, which was replaced with oral Potassium 40 MEQ. Patient subsequently medically cleared. Patient reports that she went back home to her mothers home began to here voices which told her to "get away from my mother". Patient left her home and rented a hotel room. Patient reports that voices continued while at the hotel room and made her feel frighten and if someone was out to get her. Patient endorses that she continued to hear the voices while here at [facility], however, doesn't feel that anyone can harm her because she is here. Upon arrival patient was started on Risperidone 1 mg BID and reports that she was able to sleep until awaken this morning during rounds. Patient denies any prior psychiatric history although endorses auditory hallucinations have been ongoing for several months however recently worsened. She has never sought any psychiatric treatment previously. Patient voices that she wants help. Patient denies any physical complaints although endorses occasional headache, although feels fine right now. Pt denies suicidal or homicidal ideations.  Patient denies any visual hallucinations. 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 [employer]   Additional Social History:    Pain Medications: See [facility] Prescriptions: See MAR Over the Counter: See MAR History of alcohol / drug use?: No history of alcohol / drug abuse Longest period of sobriety (when/how long): NA Negative Consequences of Use:  (NA) Withdrawal Symptoms: None                    Sleep: Poor  Appetite:  Fair  Current Medications:  Current Facility-Administered Medications  Medication Dose Route Frequency Provider Last Rate Last Admin   acetaminophen (TYLENOL) tablet 650 mg  650 mg Oral Q6H PRN Bobbitt, Shalon E, NP       alum & mag hydroxide-simeth (MAALOX/MYLANTA) 200-200-20 MG/5ML suspension 30 mL  30 mL Oral Q4H PRN Bobbitt, Shalon E, NP       haloperidol (HALDOL) tablet 5 mg  5 mg Oral TID PRN Bobbitt, Shalon E, NP       And   diphenhydrAMINE (BENADRYL) capsule 50 mg  50 mg Oral TID PRN Bobbitt, Shalon E, NP       haloperidol lactate (HALDOL) injection 5 mg  5 mg Intramuscular TID PRN Bobbitt, Shalon E, NP       And   diphenhydrAMINE (BENADRYL) injection 50 mg  50 mg Intramuscular TID  PRN Bobbitt, Shalon E, NP       And   LORazepam (ATIVAN) injection 2 mg  2 mg Intramuscular TID PRN Bobbitt, Shalon E, NP       haloperidol lactate (HALDOL) injection 10 mg  10 mg Intramuscular TID PRN Bobbitt, Shalon E, NP       And   diphenhydrAMINE (BENADRYL) injection 50 mg  50 mg Intramuscular TID PRN Bobbitt, Shalon E, NP       And   LORazepam (ATIVAN) injection 2 mg  2 mg Intramuscular TID PRN Bobbitt, Shalon E, NP       hydrOXYzine (ATARAX) tablet 25 mg  25  mg Oral TID PRN Bobbitt, Shalon E, NP       magnesium hydroxide (MILK OF MAGNESIA) suspension 30 mL  30 mL Oral Daily PRN Bobbitt, Shalon E, NP       risperiDONE (RISPERDAL) tablet 1 mg  1 mg Oral BID Bobbitt, Shalon E, NP   1 mg at 06/27/23 0558   traZODone (DESYREL) tablet 50 mg  50 mg Oral QHS PRN Bobbitt, Shalon E, NP       Current Outpatient Medications  Medication Sig Dispense Refill   albuterol  (VENTOLIN  HFA) 108 (90 Base) MCG/ACT inhaler Inhale 2 puffs into the lungs every 6 (six) hours as needed for wheezing or shortness of breath. 8 g 0    Labs  Lab Results:  Admission on 06/27/2023  Component Date Value Ref Range Status   Alcohol, Ethyl (B) 06/27/2023 <15  <15 mg/dL Final   Comment: (NOTE) For medical purposes only. Performed at [facility], [address], [place], [place] [address]    WBC 06/27/2023 12.2 (H)  4.0 - 10.5 K/uL Final   RBC 06/27/2023 4.84  3.87 - 5.11 MIL/uL Final   Hemoglobin 06/27/2023 12.4  12.0 - 15.0 g/dL Final   HCT 83/15/1761 39.0  36.0 - 46.0 % Final   MCV 06/27/2023 80.6  80.0 - 100.0 fL Final   MCH 06/27/2023 25.6 (L)  26.0 - 34.0 pg Final   MCHC 06/27/2023 31.8  30.0 - 36.0 g/dL Final   RDW 60/73/7106 14.2  11.5 - 15.5 % Final   Platelets 06/27/2023 249  150 - 400 K/uL Final   nRBC 06/27/2023 0.0  0.0 - 0.2 % Final   Neutrophils Relative % 06/27/2023 66  % Final   Neutro Abs 06/27/2023 8.2 (H)  1.7 - 7.7 K/uL Final   Lymphocytes Relative 06/27/2023 22  % Final   Lymphs Abs 06/27/2023 2.7  0.7 - 4.0 K/uL Final   Monocytes Relative 06/27/2023 10  % Final   Monocytes Absolute 06/27/2023 1.2 (H)  0.1 - 1.0 K/uL Final   Eosinophils Relative 06/27/2023 1  % Final   Eosinophils Absolute 06/27/2023 0.1  0.0 - 0.5 K/uL Final   Basophils Relative 06/27/2023 1  % Final   Basophils Absolute 06/27/2023 0.1  0.0 - 0.1 K/uL Final   Immature Granulocytes 06/27/2023 0  % Final   Abs Immature Granulocytes 06/27/2023 0.04  0.00 - 0.07 K/uL Final    Performed at [facility], [address] [place], [place] [address]   Potassium 06/27/2023 3.4 (L)  3.5 - 5.1 mmol/L Final   Performed at [facility], [address], [place], [place] [address]  Admission on 06/26/2023, Discharged on 06/26/2023  Component Date Value Ref Range Status   B Natriuretic Peptide 06/26/2023 40.0  0.0 - 100.0 pg/mL Final   Performed  at [facility], [address], [place], [place] [address]   WBC 06/26/2023 17.0 (H)  4.0 - 10.5 K/uL Final   RBC 06/26/2023 5.14 (H)  3.87 - 5.11 MIL/uL Final   Hemoglobin 06/26/2023 13.2  12.0 - 15.0 g/dL Final   HCT 62/13/0865 40.7  36.0 - 46.0 % Final   MCV 06/26/2023 79.2 (L)  80.0 - 100.0 fL Final   MCH 06/26/2023 25.7 (L)  26.0 - 34.0 pg Final   MCHC 06/26/2023 32.4  30.0 - 36.0 g/dL Final   RDW 78/46/9629 14.2  11.5 - 15.5 % Final   Platelets 06/26/2023 332  150 - 400 K/uL Final   nRBC 06/26/2023 0.0  0.0 - 0.2 % Final   Performed at [facility], [address] [place], [place] [address]   Sodium 06/26/2023 136  135 - 145 mmol/L Final   Potassium 06/26/2023 3.3 (L)  3.5 - 5.1 mmol/L Final   Chloride 06/26/2023 103  98 - 111 mmol/L Final   CO2 06/26/2023 22  22 - 32 mmol/L Final   Glucose, Bld 06/26/2023 118 (H)  70 - 99 mg/dL Final   Glucose reference range applies only to samples taken after fasting for at least 8 hours.   BUN 06/26/2023 8  6 - 20 mg/dL Final   Creatinine, Ser 06/26/2023 0.81  0.44 - 1.00 mg/dL Final   Calcium 32/44/0102 9.1  8.9 - 10.3 mg/dL Final   GFR, Estimated 06/26/2023 >60  >60 mL/min Final   Comment: (NOTE) Calculated using the CKD-EPI Creatinine Equation (2021)    Anion gap 06/26/2023 11  5 - 15 Final   Performed at [facility], [address], [place], [place] [address]   D-Dimer, Quant 06/26/2023 0.33  0.00 - 0.50 ug/mL-FEU Final   Comment: (NOTE) At the manufacturer cut-off value of 0.5 g/mL FEU, this assay has  a negative predictive value of 95-100%.This assay is intended for use in conjunction with a clinical pretest probability (PTP) assessment model to exclude pulmonary embolism (PE) and deep venous thrombosis (DVT) in outpatients suspected of PE or DVT. Results should be correlated with clinical presentation. Performed at [facility], [address] [place], [place] [address]    Color, Urine 06/26/2023 YELLOW  YELLOW Final   APPearance 06/26/2023 CLEAR  CLEAR Final   Specific Gravity, Urine 06/26/2023 1.021  1.005 - 1.030 Final   pH 06/26/2023 6.0  5.0 - 8.0 Final   Glucose, UA 06/26/2023 NEGATIVE  NEGATIVE mg/dL Final   Hgb urine dipstick 06/26/2023 SMALL (A)  NEGATIVE Final   Bilirubin Urine 06/26/2023 NEGATIVE  NEGATIVE Final   Ketones, ur 06/26/2023 20 (A)  NEGATIVE mg/dL Final   Protein, ur 47/42/5956 NEGATIVE  NEGATIVE mg/dL Final   Nitrite 38/75/6433 NEGATIVE  NEGATIVE Final   Leukocytes,Ua 06/26/2023 NEGATIVE  NEGATIVE Final   RBC / HPF 06/26/2023 11-20  0 - 5 RBC/hpf Final   WBC, UA 06/26/2023 0-5  0 - 5 WBC/hpf Final   Bacteria, UA 06/26/2023 NONE SEEN  NONE SEEN Final   Squamous Epithelial / HPF 06/26/2023 0-5  0 - 5 /HPF Final   Mucus 06/26/2023 PRESENT   Final   Performed at [facility], [address], [place], [place] [address]   Opiates 06/26/2023 NONE DETECTED  NONE DETECTED Final   Cocaine 06/26/2023 NONE DETECTED  NONE DETECTED Final   Benzodiazepines 06/26/2023 NONE DETECTED  NONE DETECTED Final   Amphetamines 06/26/2023 NONE  DETECTED  NONE DETECTED Final   Tetrahydrocannabinol 06/26/2023 NONE DETECTED  NONE DETECTED Final   Barbiturates 06/26/2023 NONE DETECTED  NONE DETECTED Final   Comment: (NOTE) DRUG SCREEN FOR MEDICAL PURPOSES ONLY.  IF CONFIRMATION IS NEEDED FOR ANY PURPOSE, NOTIFY LAB WITHIN 5 DAYS.  LOWEST DETECTABLE LIMITS FOR URINE DRUG SCREEN Drug Class                     Cutoff (ng/mL) Amphetamine and  metabolites    1000 Barbiturate and metabolites    200 Benzodiazepine                 200 Opiates and metabolites        300 Cocaine and metabolites        300 THC                            50 Performed at [facility], [address] [place], [place] [address]    Lactic Acid, Venous 06/26/2023 0.9  0.5 - 1.9 mmol/L Final   Specimen Description 06/26/2023    Final                   Value:BLOOD RIGHT ARM Performed at [facility], [address], [place], [place] [address]    Special Requests 06/26/2023    Final                   Value:BOTTLES DRAWN AEROBIC AND ANAEROBIC Blood Culture results may not be optimal due to an inadequate volume of blood received in culture bottles Performed at [facility], [address], [place], [place] [address]    Culture 06/26/2023    Final                   Value:NO GROWTH < 24 HOURS Performed at [facility], [address], [place], [place] [address]    Report Status 06/26/2023 PENDING   Incomplete   Specimen Description 06/26/2023    Final                   Value:BLOOD LEFT ARM Performed at [facility], [address] [place], [place] [address]    Special Requests 06/26/2023    Final                   Value:BOTTLES DRAWN AEROBIC AND ANAEROBIC Blood Culture results may not be optimal due to an inadequate volume of blood received in culture bottles Performed at [facility], [address] [place], [place] [address]    Culture 06/26/2023    Final                   Value:NO GROWTH < 24 HOURS Performed at [facility], [address], [place], [place] [address]    Report Status 06/26/2023 PENDING   Incomplete   Preg Test, Ur 06/26/2023 NEGATIVE  NEGATIVE Final   Comment:        THE SENSITIVITY OF THIS METHODOLOGY IS >25 mIU/mL. Performed at [facility], [address] [place], [place] [address]    TSH 06/26/2023 0.312 (L)  0.350 - 4.500  uIU/mL Final   Comment: Performed by a 3rd Generation assay with a functional sensitivity of <=0.01 uIU/mL. Performed at [facility], [address] [place], [place] [address]  Blood Alcohol level:  Lab Results  Component Value Date   [facility] <15 06/27/2023    Metabolic Disorder Labs: Therapeutic Lab Levels:  Physical Findings   PHQ2-9    Flowsheet Row Office Visit from 05/07/2016 in Primary Care at [facility] Visit from 08/14/2015 in Primary Care at [facility] Total Score 0 0      Flowsheet Row ED from 06/27/2023 in [facility] ED from 06/26/2023 in [facility] Emergency Department at [facility]  C-SSRS RISK CATEGORY No Risk No Risk        Musculoskeletal  Strength & Muscle Tone: within normal limits Gait & Station: normal Patient leans: N/A  Psychiatric Specialty Exam  Presentation  General Appearance:  Casual  Eye Contact: Good  Speech: Clear and Coherent  Speech Volume: Normal  Handedness: Right   Mood and Affect  Mood: Euthymic  Affect: Appropriate   Thought Process  Thought Processes: Coherent  Descriptions of Associations:Intact  Orientation:Full (Time, Place and Person)  Thought Content:WDL  Diagnosis of Schizophrenia or Schizoaffective disorder in past: No  Duration of Psychotic Symptoms: Greater than six months   Hallucinations:Hallucinations: Auditory Description of Auditory Hallucinations: voice saying the feds are going to come get her  Ideas of Reference:None  Suicidal Thoughts:Suicidal Thoughts: No  Homicidal Thoughts:Homicidal Thoughts: No   Sensorium  Memory: Immediate Fair; Recent Fair; Remote Fair  Judgment: Fair  Insight: Fair   Art therapist  Concentration: Fair  Attention Span: Fair  Recall: [employer] of Knowledge: Fair  Language: Fair   Psychomotor Activity  Psychomotor Activity: Psychomotor Activity:  Normal   Assets  Assets: Housing; Manufacturing systems engineer; Desire for Improvement; Physical Health; Resilience; Vocational/Educational   Sleep  Sleep: Sleep: Fair Number of Hours of Sleep: 5   Nutritional Assessment (For OBS and FBC admissions only) Has the patient had a weight loss or gain of 10 pounds or more in the last 3 months?: No Has the patient had a decrease in food intake/or appetite?: No Does the patient have dental problems?: No Does the patient have eating habits or behaviors that may be indicators of an eating disorder including binging or inducing vomiting?: No Has the patient recently lost weight without trying?: 0    Physical Exam  Physical Exam Constitutional:      General: She is not in acute distress.    Appearance: Normal appearance. She is not ill-appearing.  HENT:     Head: Normocephalic and atraumatic.     Nose: Nose normal.  Eyes:     Extraocular Movements: Extraocular movements intact.     Conjunctiva/sclera: Conjunctivae normal.     Pupils: Pupils are equal, round, and reactive to light.  Cardiovascular:     Rate and Rhythm: Normal rate and regular rhythm.  Pulmonary:     Effort: Pulmonary effort is normal.     Breath sounds: Normal breath sounds.  Musculoskeletal:     Cervical back: Normal range of motion and neck supple.  Skin:    General: Skin is warm and dry.  Neurological:     General: No focal deficit present.     Mental Status: She is alert and oriented to person, place, and time.    Review of Systems  Psychiatric/Behavioral:  Positive for hallucinations. Negative for depression, substance abuse and suicidal ideas. The patient is nervous/anxious and has insomnia.     Blood pressure 120/85, pulse (!) 103, temperature 98.5 F (36.9 C), temperature source Oral, resp. rate 18, SpO2  99%. There is no height or weight on file to calculate BMI.  Treatment Plan Summary: Daily contact with patient to assess and evaluate symptoms and  progress in treatment, Medication management, and Plan :Continuing with recommendation for inpatient psychiatric treatment. Will continue Risperidone 1 mg BID, agitation protocol ordered. For anxiety, start propranolol 10 mg PRN BID PRN which can improve mild tachycardia TID. CBC repeated trending down from 17.0 to 12.2. Potassium mildly low 3.4, will replete potassium orally with Kdur 40 MEQ.    [facility] AC, Disposition social worker notified of inpatient recommendation and bed request has been made.  Pt is pending review for placement.   Cydney Draft, NP 06/27/2023 12:03 PM

## 2023-06-27 NOTE — ED Notes (Signed)
 Pt A&O x 4, presents with anxiety and paranoia. Pt admits to hearing voices and seeing people.  Denies SI, HI.  Monitoring for safety.

## 2023-06-27 NOTE — ED Notes (Signed)
 Patient alert and oriented.  Denies SI, HI, VH, and pain. Pt reports hearing voices stating her mother is sick and in poor heath.  Pt spoke with mother yesterday and reported she is fine, just the voices keep telling her otherwise. Scheduled medications administered to patient, per MD orders. Support and encouragement provided.  Routine safety checks conducted every hour.  Patient informed to notify staff with problems or concerns. No adverse drug reactions noted. Patient contracts for safety at this time. Patient compliant with medications and treatment plan. Patient receptive, calm, and cooperative. Patient interacts well with others on the unit.  Patient remains safe at this time.

## 2023-06-27 NOTE — ED Notes (Signed)
 Pt watching TV at this time. NAD noted. Will continue to monitor.

## 2023-06-27 NOTE — ED Notes (Signed)
 Pt sleeping at this time. Rise and fall of chest noted. Pt in NAD at this time. Will continue to monitor.
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
## 2023-06-27 NOTE — Progress Notes (Signed)
 Patient has been denied by [facility] due to no appropriate beds available. Patient meets BH inpatient criteria per Cydney Draft, NP. Patient has been faxed out to the following facilities:   [facility] [address] [place] [address] [phone]  [facility] [address], [place] [address] [phone]  [facility] [address], [place] [address] [phone]  [facility] [address] [place], [place] [address] [phone]  [facility] Patient Placement [facility], [place] [phone]  [facility] [address] [place] [address] [phone]  [facility] [address] [place] [address] [phone]  [facility] Josephina Nicks [place] [address] [phone]  [facility] [address] [place] [address] [phone]  [facility] [address] [place] [address] [phone]  [facility] EFAX [address] [place], [place] [phone]  [facility] [address] Johnella Naas [place] [address] [phone]  [facility] [address], [place] [address] [phone]  [facility] [address] [place] [address] [phone]  [facility] [address] [place], [place] [address] [phone]  [facility] [address] [place] [address] [phone]  [facility] [address], [place] [address] [phone]  [facility] [address] [place] [address] [phone]  [facility] [address], [place] [address] [phone]  [facility] [address],  [place] [address] [phone]   Phares Brasher, MSW, LCSW-A  6:34 PM 06/27/2023

## 2023-06-28 DIAGNOSIS — F23 Brief psychotic disorder: Secondary | ICD-10-CM | POA: Diagnosis not present

## 2023-06-28 LAB — CBC WITH DIFFERENTIAL/PLATELET
Abs Immature Granulocytes: 0.05 10*3/uL (ref 0.00–0.07)
Basophils Absolute: 0.1 10*3/uL (ref 0.0–0.1)
Basophils Relative: 1 %
Eosinophils Absolute: 0.1 10*3/uL (ref 0.0–0.5)
Eosinophils Relative: 1 %
HCT: 43.3 % (ref 36.0–46.0)
Hemoglobin: 13.8 g/dL (ref 12.0–15.0)
Immature Granulocytes: 0 %
Lymphocytes Relative: 22 %
Lymphs Abs: 2.8 10*3/uL (ref 0.7–4.0)
MCH: 25.5 pg — ABNORMAL LOW (ref 26.0–34.0)
MCHC: 31.9 g/dL (ref 30.0–36.0)
MCV: 80 fL (ref 80.0–100.0)
Monocytes Absolute: 1.1 10*3/uL — ABNORMAL HIGH (ref 0.1–1.0)
Monocytes Relative: 9 %
Neutro Abs: 8.4 10*3/uL — ABNORMAL HIGH (ref 1.7–7.7)
Neutrophils Relative %: 67 %
Platelets: 238 10*3/uL (ref 150–400)
RBC: 5.41 MIL/uL — ABNORMAL HIGH (ref 3.87–5.11)
RDW: 14.2 % (ref 11.5–15.5)
WBC: 12.6 10*3/uL — ABNORMAL HIGH (ref 4.0–10.5)
nRBC: 0 % (ref 0.0–0.2)

## 2023-06-28 LAB — POTASSIUM: Potassium: 3.7 mmol/L (ref 3.5–5.1)

## 2023-06-28 NOTE — ED Notes (Signed)
 Patient resting with eyes closed. Respirations even and unlabored. No distress noted. Environment secured. Plan of care ongoing, no further concerns as of present.

## 2023-06-28 NOTE — ED Notes (Signed)
 The patient is sitting in the recliner, watching television. No distress noted. Environment is secured. Plan of care ongoing, no further concerns as of present. Patient expresses no other needs at this time.

## 2023-06-28 NOTE — ED Provider Notes (Signed)
 Behavioral Health Progress Note  Date and Time: 06/28/2023 10:36 AM Name: Holly Kemp MRN:  [id number]  Subjective:  "I'm feeling much better"  Diagnosis:  Final diagnoses:  Hallucinations  Paranoia (psychosis) (HCC)  Low serum thyroid stimulating hormone (TSH)   Total Time spent with patient: 30 minutes  Patient seen today during rounds and reports improvement of feeling of anxiousness and auditory hallucinations. Patient admitted on 06/27/2023 due to acute onset psychosis with paranoia with thoughts that her mother was sending federal officers to pick her up. Patient has no prior psychiatric history although endorses that she has suffered from anxiety for several years although the auditory hallucinations have been present for about a year. She endorses at present still hearing faint voices while here on the unit, however, the voices are mostly related to ongoing conflict with there mother. Patient currently lives with mother and they do not get along. She has a hotel room with all of her personal items, however, reports she plans to either renew the room or try and get an apartment as she doesn't with to return to her mothers home as patient feels that the ongoing conflict between she and mom is the source of stress which is contributing to problems with her mental health. Patient continues to deny suicidal and or homicidal ideations. She continues to deny any visual hallucinations. Revisited discussion pertaining to inpatient hospitalization and patient has to return to there hotel room by June 10th to remove items or extend stay and will need to return to work at the [place] [employer] later this week. Patient is in agreement to remain here at [facility] for an additional days with a plan to continue current medications, obtain head ct (non-contrast) tomorrow and discharge home with intensive outpatient follow-up, baring no abnormalities seen on head CT.    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  Additional Social History:    Pain Medications: See [facility] Prescriptions: See MAR Over the Counter: See MAR History of alcohol / drug use?: No history of alcohol / drug abuse Longest period of sobriety (when/how long): NA Negative Consequences of Use:  (NA) Withdrawal Symptoms: None                    Sleep: Good(significant improvement compared to prior to admission)  Appetite:  Good  Current Medications:  Current Facility-Administered Medications  Medication Dose Route Frequency Provider Last Rate Last Admin   acetaminophen (TYLENOL) tablet 650 mg  650 mg Oral Q6H PRN Bobbitt, Shalon E, NP       alum & mag hydroxide-simeth (MAALOX/MYLANTA) 200-200-20 MG/5ML suspension 30 mL  30 mL Oral Q4H PRN Bobbitt, Shalon E, NP       haloperidol (HALDOL) tablet 5 mg  5 mg Oral TID PRN Bobbitt, Shalon E, NP       And   diphenhydrAMINE (BENADRYL) capsule 50 mg  50 mg Oral TID PRN Bobbitt, Shalon E, NP       haloperidol lactate (HALDOL) injection 5 mg  5 mg Intramuscular TID PRN Bobbitt, Shalon E, NP       And   diphenhydrAMINE (BENADRYL) injection 50 mg  50 mg Intramuscular TID PRN Bobbitt, Shalon E, NP       And   LORazepam (ATIVAN)  injection 2 mg  2 mg Intramuscular TID PRN Bobbitt, Shalon E, NP       haloperidol lactate (HALDOL) injection 10 mg  10 mg Intramuscular TID PRN Bobbitt, Shalon E, NP       And   diphenhydrAMINE (BENADRYL) injection 50 mg  50 mg Intramuscular TID PRN Bobbitt, Shalon E, NP       And   LORazepam (ATIVAN) injection 2 mg  2 mg Intramuscular TID PRN Bobbitt, Shalon E, NP       hydrOXYzine (ATARAX) tablet 25 mg  25 mg Oral TID PRN Bobbitt, Shalon E, NP   25 mg at 06/28/23 0915   magnesium hydroxide (MILK OF  MAGNESIA) suspension 30 mL  30 mL Oral Daily PRN Bobbitt, Shalon E, NP       risperiDONE (RISPERDAL) tablet 1 mg  1 mg Oral BID Bobbitt, Shalon E, NP   1 mg at 06/28/23 0915   traZODone (DESYREL) tablet 50 mg  50 mg Oral QHS PRN Bobbitt, Shalon E, NP   50 mg at 06/27/23 2112   Current Outpatient Medications  Medication Sig Dispense Refill   albuterol  (VENTOLIN  HFA) 108 (90 Base) MCG/ACT inhaler Inhale 2 puffs into the lungs every 6 (six) hours as needed for wheezing or shortness of breath. 8 g 0    Labs  Lab Results:  Admission on 06/27/2023  Component Date Value Ref Range Status   Alcohol, Ethyl (B) 06/27/2023 <15  <15 mg/dL Final   Comment: (NOTE) For medical purposes only. Performed at [facility], [address], [place], [place] [address]    WBC 06/27/2023 12.2 (H)  4.0 - 10.5 K/uL Final   RBC 06/27/2023 4.84  3.87 - 5.11 MIL/uL Final   Hemoglobin 06/27/2023 12.4  12.0 - 15.0 g/dL Final   HCT 96/29/5284 39.0  36.0 - 46.0 % Final   MCV 06/27/2023 80.6  80.0 - 100.0 fL Final   MCH 06/27/2023 25.6 (L)  26.0 - 34.0 pg Final   MCHC 06/27/2023 31.8  30.0 - 36.0 g/dL Final   RDW 13/24/4010 14.2  11.5 - 15.5 % Final   Platelets 06/27/2023 249  150 - 400 K/uL Final   nRBC 06/27/2023 0.0  0.0 - 0.2 % Final   Neutrophils Relative % 06/27/2023 66  % Final   Neutro Abs 06/27/2023 8.2 (H)  1.7 - 7.7 K/uL Final   Lymphocytes Relative 06/27/2023 22  % Final   Lymphs Abs 06/27/2023 2.7  0.7 - 4.0 K/uL Final   Monocytes Relative 06/27/2023 10  % Final   Monocytes Absolute 06/27/2023 1.2 (H)  0.1 - 1.0 K/uL Final   Eosinophils Relative 06/27/2023 1  % Final   Eosinophils Absolute 06/27/2023 0.1  0.0 - 0.5 K/uL Final   Basophils Relative 06/27/2023 1  % Final   Basophils Absolute 06/27/2023 0.1  0.0 - 0.1 K/uL Final   Immature Granulocytes 06/27/2023 0  % Final   Abs Immature Granulocytes 06/27/2023 0.04  0.00 - 0.07 K/uL Final   Performed at [facility], [address]  [place], [place] [address]   Potassium 06/27/2023 3.4 (L)  3.5 - 5.1 mmol/L Final   Performed at [facility], [address], [place], [place] [address]   WBC 06/28/2023 12.6 (H)  4.0 - 10.5 K/uL Final   RBC 06/28/2023 5.41 (H)  3.87 - 5.11 MIL/uL Final   Hemoglobin 06/28/2023 13.8  12.0 - 15.0 g/dL Final   HCT 34/74/2595 43.3  36.0 - 46.0 % Final   MCV 06/28/2023 80.0  80.0 - 100.0 fL Final   MCH 06/28/2023 25.5 (L)  26.0 - 34.0 pg Final   MCHC 06/28/2023 31.9  30.0 - 36.0 g/dL Final   RDW 40/98/1191 14.2  11.5 - 15.5 % Final   Platelets 06/28/2023 238  150 - 400 K/uL Final   nRBC 06/28/2023 0.0  0.0 - 0.2 % Final   Neutrophils Relative % 06/28/2023 67  % Final   Neutro Abs 06/28/2023 8.4 (H)  1.7 - 7.7 K/uL Final   Lymphocytes Relative 06/28/2023 22  % Final   Lymphs Abs 06/28/2023 2.8  0.7 - 4.0 K/uL Final   Monocytes Relative 06/28/2023 9  % Final   Monocytes Absolute 06/28/2023 1.1 (H)  0.1 - 1.0 K/uL Final   Eosinophils Relative 06/28/2023 1  % Final   Eosinophils Absolute 06/28/2023 0.1  0.0 - 0.5 K/uL Final   Basophils Relative 06/28/2023 1  % Final   Basophils Absolute 06/28/2023 0.1  0.0 - 0.1 K/uL Final   Immature Granulocytes 06/28/2023 0  % Final   Abs Immature Granulocytes 06/28/2023 0.05  0.00 - 0.07 K/uL Final   Performed at [facility], [address] [place], [place] [address]   Potassium 06/28/2023 3.7  3.5 - 5.1 mmol/L Final   Performed at [facility], [address], [place], [place] [address]  Admission on 06/26/2023, Discharged on 06/26/2023  Component Date Value Ref Range Status   B Natriuretic Peptide 06/26/2023 40.0  0.0 - 100.0 pg/mL Final   Performed at [facility], [address], [place], [place] [address]   WBC 06/26/2023 17.0 (H)  4.0 - 10.5 K/uL Final   RBC 06/26/2023 5.14 (H)  3.87 - 5.11 MIL/uL Final   Hemoglobin 06/26/2023 13.2  12.0 - 15.0 g/dL Final   HCT 84/69/6295 40.7  36.0 - 46.0 % Final   MCV 06/26/2023  79.2 (L)  80.0 - 100.0 fL Final   MCH 06/26/2023 25.7 (L)  26.0 - 34.0 pg Final   MCHC 06/26/2023 32.4  30.0 - 36.0 g/dL Final   RDW 28/41/3244 14.2  11.5 - 15.5 % Final   Platelets 06/26/2023 332  150 - 400 K/uL Final   nRBC 06/26/2023 0.0  0.0 - 0.2 % Final   Performed at [facility], [address], [place], [place] [address]   Sodium 06/26/2023 136  135 - 145 mmol/L Final   Potassium 06/26/2023 3.3 (L)  3.5 - 5.1 mmol/L Final   Chloride 06/26/2023 103  98 - 111 mmol/L Final   CO2 06/26/2023 22  22 - 32 mmol/L Final   Glucose, Bld 06/26/2023 118 (H)  70 - 99 mg/dL Final   Glucose reference range applies only to samples taken after fasting for at least 8 hours.   BUN 06/26/2023 8  6 - 20 mg/dL Final   Creatinine, Ser 06/26/2023 0.81  0.44 - 1.00 mg/dL Final   Calcium 25/36/6440 9.1  8.9 - 10.3 mg/dL Final   GFR, Estimated 06/26/2023 >60  >60 mL/min Final   Comment: (NOTE) Calculated using the CKD-EPI Creatinine Equation (2021)    Anion gap 06/26/2023 11  5 - 15 Final   Performed at [facility], [address] [place], [place] [address]   D-Dimer, Quant 06/26/2023 0.33  0.00 - 0.50 ug/mL-FEU Final   Comment: (NOTE) At the manufacturer cut-off value of 0.5 g/mL FEU, this assay has  a negative predictive value of 95-100%.This assay is intended for use in conjunction with a clinical pretest probability (PTP) assessment model to exclude pulmonary embolism (PE) and deep venous thrombosis (DVT) in outpatients suspected of PE or DVT. Results should be correlated with clinical presentation. Performed at [facility], [address], [place], [place] [address]    Color, Urine 06/26/2023 YELLOW  YELLOW Final   APPearance 06/26/2023 CLEAR  CLEAR Final   Specific Gravity, Urine 06/26/2023 1.021  1.005 - 1.030 Final   pH 06/26/2023 6.0  5.0 - 8.0 Final   Glucose, UA 06/26/2023 NEGATIVE  NEGATIVE mg/dL Final   Hgb urine dipstick  06/26/2023 SMALL (A)  NEGATIVE Final   Bilirubin Urine 06/26/2023 NEGATIVE  NEGATIVE Final   Ketones, ur 06/26/2023 20 (A)  NEGATIVE mg/dL Final   Protein, ur 19/14/7829 NEGATIVE  NEGATIVE mg/dL Final   Nitrite 56/21/3086 NEGATIVE  NEGATIVE Final   Leukocytes,Ua 06/26/2023 NEGATIVE  NEGATIVE Final   RBC / HPF 06/26/2023 11-20  0 - 5 RBC/hpf Final   WBC, UA 06/26/2023 0-5  0 - 5 WBC/hpf Final   Bacteria, UA 06/26/2023 NONE SEEN  NONE SEEN Final   Squamous Epithelial / HPF 06/26/2023 0-5  0 - 5 /HPF Final   Mucus 06/26/2023 PRESENT   Final   Performed at [facility], [address], [place], [place] [address]   Opiates 06/26/2023 NONE DETECTED  NONE DETECTED Final   Cocaine 06/26/2023 NONE DETECTED  NONE DETECTED Final   Benzodiazepines 06/26/2023 NONE DETECTED  NONE DETECTED Final   Amphetamines 06/26/2023 NONE DETECTED  NONE DETECTED Final   Tetrahydrocannabinol 06/26/2023 NONE DETECTED  NONE DETECTED Final   Barbiturates 06/26/2023 NONE DETECTED  NONE DETECTED Final   Comment: (NOTE) DRUG SCREEN FOR MEDICAL PURPOSES ONLY.  IF CONFIRMATION IS NEEDED FOR ANY PURPOSE, NOTIFY LAB WITHIN 5 DAYS.  LOWEST DETECTABLE LIMITS FOR URINE DRUG SCREEN Drug Class                     Cutoff (ng/mL) Amphetamine and metabolites    1000 Barbiturate and metabolites    200 Benzodiazepine                 200 Opiates and metabolites        300 Cocaine and metabolites        300 THC                            50 Performed at [facility], [address] [place], [place] [address]    Lactic Acid, Venous 06/26/2023 0.9  0.5 - 1.9 mmol/L Final   Specimen Description 06/26/2023    Final                   Value:BLOOD RIGHT ARM Performed at [facility], [address], [place], [place] [address]    Special Requests 06/26/2023    Final                   Value:BOTTLES DRAWN AEROBIC AND ANAEROBIC Blood Culture results may not be optimal due to an inadequate  volume of blood received in culture bottles Performed at [facility], [address] [place], [place] [address]    Culture 06/26/2023    Final                   Value:NO  GROWTH < 24 HOURS Performed at [facility], [address] [place], [place] [address]    Report Status 06/26/2023 PENDING   Incomplete   Specimen Description 06/26/2023    Final                   Value:BLOOD LEFT ARM Performed at [facility], [address] [place], [place] [address]    Special Requests 06/26/2023    Final                   Value:BOTTLES DRAWN AEROBIC AND ANAEROBIC Blood Culture results may not be optimal due to an inadequate volume of blood received in culture bottles Performed at [facility], [address] [place], [place] [address]    Culture 06/26/2023    Final                   Value:NO GROWTH < 24 HOURS Performed at [facility], [address] [place], [place] [address]    Report Status 06/26/2023 PENDING   Incomplete   Preg Test, Ur 06/26/2023 NEGATIVE  NEGATIVE Final   Comment:        THE SENSITIVITY OF THIS METHODOLOGY IS >25 mIU/mL. Performed at [facility], [address] [place], [place] [address]    TSH 06/26/2023 0.312 (L)  0.350 - 4.500 uIU/mL Final   Comment: Performed by a 3rd Generation assay with a functional sensitivity of <=0.01 uIU/mL. Performed at [facility], [address], [place], [place] [address]     Blood Alcohol level:  Lab Results  Component Value Date   [facility] <15 06/27/2023      Physical Findings   [id number]    Flowsheet Row Office Visit from 05/07/2016 in Primary Care at [facility] Visit from 08/14/2015 in Primary Care at [facility] Total Score 0 0      Flowsheet Row ED from 06/27/2023 in [facility] ED from 06/26/2023 in [facility] Department at [facility]  C-SSRS RISK CATEGORY No Risk No Risk         Musculoskeletal  Strength & Muscle Tone: within normal limits Gait & Station: normal Patient leans: N/A  Psychiatric Specialty Exam  Presentation  General Appearance:  Casual  Eye Contact: Good  Speech: Clear and Coherent  Speech Volume: Normal  Handedness: Right   Mood and Affect  Mood: Euthymic  Affect: Appropriate   Thought Process  Thought Processes: Coherent  Descriptions of Associations:Intact  Orientation:Full (Time, Place and Person)  Thought Content:WDL  Diagnosis of Schizophrenia or Schizoaffective disorder in past: No  Duration of Psychotic Symptoms: Greater than six months   Hallucinations:Hallucinations: Auditory Description of Auditory Hallucinations: voice saying the feds are going to come get her  Ideas of Reference:None  Suicidal Thoughts:Suicidal Thoughts: No  Homicidal Thoughts:Homicidal Thoughts: No   Sensorium  Memory: Immediate Fair; Recent Fair; Remote Fair  Judgment: Fair  Insight: Fair   Art therapist  Concentration: Fair  Attention Span: Fair  Recall: [employer] of Knowledge: Fair  Language: Fair   Psychomotor Activity  Psychomotor Activity: Psychomotor Activity: Normal   Assets  Assets: Housing; Manufacturing systems engineer; Desire for Improvement; Physical Health; Resilience; Vocational/Educational   Sleep  Sleep: Sleep: Fair Number of Hours of Sleep: 5   Nutritional Assessment (For OBS and FBC admissions only) Has the patient had a weight loss or gain of 10 pounds or  more in the last 3 months?: No Has the patient had a decrease in food intake/or appetite?: No Does the patient have dental problems?: No Does the patient have eating habits or behaviors that may be indicators of an eating disorder including binging or inducing vomiting?: No Has the patient recently lost weight without trying?: 0    Physical Exam  Physical Exam Constitutional:      General: She is not in acute  distress.    Appearance: Normal appearance. She is not ill-appearing.  HENT:     Head: Normocephalic and atraumatic.     Nose: Nose normal.  Eyes:     Extraocular Movements: Extraocular movements intact.     Conjunctiva/sclera: Conjunctivae normal.     Pupils: Pupils are equal, round, and reactive to light.  Cardiovascular:     Rate and Rhythm: Normal rate and regular rhythm.  Pulmonary:     Effort: Pulmonary effort is normal.     Breath sounds: Normal breath sounds.  Musculoskeletal:     Cervical back: Normal range of motion and neck supple.  Skin:    General: Skin is warm and dry.  Neurological:     General: No focal deficit present.     Mental Status: She is alert and oriented to person, place, and time.    Review of Systems  Psychiatric/Behavioral:  Positive for hallucinations. The patient is nervous/anxious.     Blood pressure (!) 138/92, pulse (!) 102, temperature 99.2 F (37.3 C), temperature source Oral, resp. rate 18, SpO2 100%. There is no height or weight on file to calculate BMI.  Treatment Plan Summary: Daily contact with patient to assess and evaluate symptoms and progress in treatment, Medication management, and Plan : Due to new onset psychosis, obtain head ct (outpatient) tomorrow, if normal, discharge home, with Risperidone 1 mg BID and hydroxyzine 25 mg TID PRN. Will refer patient to [facility] Virtual Provider at discharge for close follow-up.   Cydney Draft, NP 06/28/2023 10:36 AM

## 2023-06-28 NOTE — ED Notes (Signed)
Pt resting quietly with eyes closed.  No pain or discomfort noted/voiced.  Breathing is even and unlabored.  Will continue to monitor for safety.  

## 2023-06-28 NOTE — ED Notes (Signed)
 Patient received breakfast.

## 2023-06-28 NOTE — ED Notes (Signed)
 Patient is observed in the milieu calmly watching television. Unlabored breathing. NAD noted. Pt stated that she is having some anxiety. PRN Atarax given as per NP order. Pt also stated that she experiences AH but the voices have "decreased". Encouragement to come to staff with worsening symptoms and or if thoughts of SI/HI arise. Pt  verbalized understanding of agreement. Facility protocol safety checks in place. Pt is safe on the unit at this time.

## 2023-06-28 NOTE — ED Notes (Signed)
 Patient A&Ox4. Denies intent/thoughts to harm self/others when asked. Denies VH; reports having AH "hearing voices telling me my mom is sick". Patient denies any physical complaints when asked. No distress noted. Support and encouragement provided. Routine safety checks conducted according to facility protocol. Encouraged patient to notify staff if thoughts of harm toward self or others arise. Endorses safety. Patient verbalized understanding and agreement. Plan of care ongoing, no further concerns as of present. Patient expresses no other needs at this time.

## 2023-06-29 ENCOUNTER — Ambulatory Visit (HOSPITAL_COMMUNITY)
Admit: 2023-06-29 | Discharge: 2023-06-29 | Disposition: A | Discharge status: Home / Self Care | Attending: Family Medicine | Admitting: Family Medicine

## 2023-06-29 ENCOUNTER — Encounter (HOSPITAL_COMMUNITY): Payer: Self-pay

## 2023-06-29 DIAGNOSIS — F23 Brief psychotic disorder: Secondary | ICD-10-CM | POA: Diagnosis not present

## 2023-06-29 DIAGNOSIS — R443 Hallucinations, unspecified: Secondary | ICD-10-CM

## 2023-06-29 MED ORDER — RISPERIDONE 1 MG PO TABS: 1.0000 mg | ORAL_TABLET | Freq: Two times a day (BID) | ORAL | 0 refills | Status: DC

## 2023-06-29 NOTE — ED Notes (Signed)
 Pt returned from outpatient radiology and is asking how much longer it will be unitl she can go

## 2023-06-29 NOTE — ED Notes (Signed)
 Pt left with staff and safe transport to go to out patient radiology for CT scan

## 2023-06-29 NOTE — Discharge Instructions (Addendum)
      Your head CT was negative of any acute findings to explain that you were hearing voices. It is important to follow-up with outpatient psychiatry for on-going work-up of mental health diagnoses

## 2023-06-29 NOTE — ED Notes (Signed)
 Pt presented sitting on recliner.  Calm upon approach,  Pt stated that she has not heard any voices today, and that the voices stopped yesterday.  Denied current SI plan and intent Hourly rounds continue for safety

## 2023-06-29 NOTE — ED Provider Notes (Signed)
 FBC/OBS ASAP Discharge Summary  Date and Time: 06/29/2023 6:35 PM  Name: Holly Kemp  MRN:  [id number]   Discharge Diagnoses:  Final diagnoses:  Hallucinations  Paranoia (psychosis) (HCC)  Low serum thyroid stimulating hormone (TSH)    Subjective: "I'm good"  Stay Summary: Holly Kemp was admitted to [facility] Continuous Assessment unit due to auditory hallucinations, anxiety, insomnia, and crisis management.  Patient initially was recommended for inpatient psychiatric treatment however after initiation of risperidone 1 mg twice daily patient's symptoms began to rapidly improved within 24 hours.  Patient endorsed continuing hearing mild auditory hallucinations however they were not commanding her to do anything and she was able to achieve several hours of sleep.  Patient was monitored for an additional 24 hours in order to obtain a CT of the head given patient's lack of prior psychiatric history.  CT of the head was negative.  Patient's lab results were significant for a low TSH indicating hyperthyroidism however T4 T3 were not ordered patient is aware of the need to follow up with a primary care doctor for additional follow-up as this could be contributing to her symptoms of worsening anxiety and insomnia.  Patient was provided with outpatient treatment in an intensive outpatient setting through [facility], this writer was able to place the referral and patient is scheduled to follow up with [facility] on 07/02/2023. Patient stabilized appropriately and is safe for discharge.  Patient denies any suicidal or homicidal ideations which she never reported at all during this admission.  Objectively patient does not appear to be psychotic and is not paranoid and appears to be generally calm.  It appears that patient has returned to her baseline and is eager to be discharged to return back to work and to secure her items out of the hotel room in which she ran out last week.  Disordered  thinking reporting that she is going to move out of her mom's house given the ongoing conflict and lack of sleep she feels has contributed to her recent symptoms.  Patient is also motivated to continue outpatient treatment and medication as she has insight into that this has helped with her current symptoms and that she needs further psychiatric workup to determine underlying cause of her symptoms of hallucinations and recent paranoid delusions.  Risperidone 1 mg twice daily were tolerated with no adverse reactions.   Holly Kemp was discharged with current medication and was instructed on how to take medications as prescribed. Holly Kemp's improvement was monitored by continuous assessment/observation and report of symptom reduction.  Her emotional and mental status were also monitored by staff.   Holly Kemp was evaluated for stability and plans for continued recovery upon discharge.  Holly Kemp will follow up with [facility] within 24 hours to schedule outpatient intensive follow-up. Upon completion of this admission the Holly Kemp was both mentally and medically stable for discharge denying suicidal/homicidal ideation, auditory/visual/tactile hallucinations, delusional thoughts and paranoia.     Total Time spent with patient: 1 hour  Past Psychiatric History:  Past Medical History:  Family History:  Family Psychiatric History:  Social History:  Tobacco Cessation:  N/A, patient does not currently use tobacco products  Current Medications:  No current facility-administered medications for this encounter.   Current Outpatient Medications  Medication Sig Dispense Refill   albuterol  (VENTOLIN  HFA) 108 (90 Base) MCG/ACT inhaler Inhale 2 puffs into the lungs every 6 (six) hours as needed for wheezing or  shortness of breath. 8 g 0   risperiDONE (RISPERDAL) 1 MG tablet Take 1 tablet (1 mg total) by mouth 2 (two) times daily. 60 tablet 0    PTA Medications:   PTA Medications  Medication Sig   albuterol  (VENTOLIN  HFA) 108 (90 Base) MCG/ACT inhaler Inhale 2 puffs into the lungs every 6 (six) hours as needed for wheezing or shortness of breath.   risperiDONE (RISPERDAL) 1 MG tablet Take 1 tablet (1 mg total) by mouth 2 (two) times daily.       05/07/2016    8:09 AM 08/14/2015   10:28 AM  Depression screen PHQ 2/9  Decreased Interest 0 0  Down, Depressed, Hopeless 0 0  PHQ - 2 Score 0 0    Flowsheet Row ED from 06/27/2023 in [facility] ED from [facility]/26/2023 in [facility] Emergency Department at [facility]  C-SSRS RISK CATEGORY No Risk No Risk       Musculoskeletal  Strength & Muscle Tone: within normal limits Gait & Station: normal Patient leans: N/A  Psychiatric Specialty Exam  Presentation  General Appearance:  Casual  Eye Contact: Good  Speech: Clear and Coherent  Speech Volume: Normal  Handedness: Right   Mood and Affect  Mood: Euthymic  Affect: Appropriate   Thought Process  Thought Processes: Coherent  Descriptions of Associations:Intact  Orientation:Full (Time, Place and Person)  Thought Content:WDL  Diagnosis of Schizophrenia or Schizoaffective disorder in past: No  Duration of Psychotic Symptoms: Greater than six months   Hallucinations:No data recorded Ideas of Reference:None  Suicidal Thoughts:No data recorded Homicidal Thoughts:No data recorded  Sensorium  Memory: Immediate Fair; Recent Fair; Remote Fair  Judgment: Fair  Insight: Fair   Art therapist  Concentration: Fair  Attention Span: Fair  Recall: [employer] of Knowledge: Fair  Language: Fair   Psychomotor Activity  Psychomotor Activity:No data recorded  Assets  Assets: Housing; Manufacturing systems engineer; Desire for Improvement; Physical Health; Resilience; Vocational/Educational   Sleep  Sleep:Good   Hours: 8  Physical Exam  Physical Exam Constitutional:       General: She is not in acute distress.    Appearance: Normal appearance. She is not ill-appearing.  HENT:     Head: Normocephalic and atraumatic.     Nose: Nose normal.  Eyes:     Extraocular Movements: Extraocular movements intact.     Conjunctiva/sclera: Conjunctivae normal.     Pupils: Pupils are equal, round, and reactive to light.  Cardiovascular:     Rate and Rhythm: Normal rate and regular rhythm.  Pulmonary:     Effort: Pulmonary effort is normal.     Breath sounds: Normal breath sounds.  Musculoskeletal:     Cervical back: Normal range of motion and neck supple.  Skin:    General: Skin is warm and dry.  Neurological:     General: No focal deficit present.     Mental Status: She is alert and oriented to person, place, and time.    Review of Systems  Psychiatric/Behavioral:  Positive for hallucinations.     Blood pressure 128/83, pulse (!) 108, temperature 98.3 F (36.8 C), temperature source Oral, resp. rate 18, last menstrual period 05/27/2023, SpO2 100%. There is no height or weight on file to calculate BMI.  Demographic Factors:  NA  Loss Factors: NA  Historical Factors: NA  Risk Reduction Factors:   Positive coping skills or problem solving skills  Continued Clinical Symptoms:  More than one psychiatric diagnosis  Cognitive Features That Contribute To Risk:  None    Suicide Risk:  Minimal: No identifiable suicidal ideation.  Patients presenting with no risk factors but with morbid ruminations; may be classified as minimal risk based on the severity of the depressive symptoms  Plan Of Care/Follow-up recommendations:  Patient presented with acute onset psychosis and was admitted to [facility] for a 48-hour stay.  Given acute onset of symptoms CT of the head was ordered and resulted as negative.  Patient stabilized with risperidone.  Risk and benefits of antipsychotic were discussed.  Patient will continue on outpatient basis for now however has scheduled  follow-up with [facility] intensive outpatient treatment partial program for ongoing management of psychiatric needs.  Patient provided a 30-day supply of risperidone.  Return precautions given if any red flag symptoms develop.  Patient verbalized understanding and agreement with plan today.   Results for orders placed or performed during the hospital encounter of 06/27/23 (from the past 48 hours)  CBC with Differential     Status: Abnormal   Collection Time: 06/28/23  7:57 AM  Result Value Ref Range   WBC 12.6 (H) 4.0 - 10.5 K/uL   RBC 5.41 (H) 3.87 - 5.11 MIL/uL   Hemoglobin 13.8 12.0 - 15.0 g/dL   HCT 77.4 12.8 - 78.6 %   MCV 80.0 80.0 - 100.0 fL   MCH 25.5 (L) 26.0 - 34.0 pg   MCHC 31.9 30.0 - 36.0 g/dL   RDW 76.7 20.9 - 47.0 %   Platelets 238 150 - 400 K/uL   nRBC 0.0 0.0 - 0.2 %   Neutrophils Relative % 67 %   Neutro Abs 8.4 (H) 1.7 - 7.7 K/uL   Lymphocytes Relative 22 %   Lymphs Abs 2.8 0.7 - 4.0 K/uL   Monocytes Relative 9 %   Monocytes Absolute 1.1 (H) 0.1 - 1.0 K/uL   Eosinophils Relative 1 %   Eosinophils Absolute 0.1 0.0 - 0.5 K/uL   Basophils Relative 1 %   Basophils Absolute 0.1 0.0 - 0.1 K/uL   Immature Granulocytes 0 %   Abs Immature Granulocytes 0.05 0.00 - 0.07 K/uL    Comment: Performed at [facility], [address] [place], [place] [address]  Potassium     Status: None   Collection Time: 06/28/23  7:57 AM  Result Value Ref Range   Potassium 3.7 3.5 - 5.1 mmol/L    Comment: Performed at [facility], [address], [place], [place] [address]       Disposition: Home to self  While future psychiatric events cannot be accurately predicted, the patient does not currently require acute inpatient psychiatric care and does not currently meet [place]  involuntary commitment criteria. Patient was able to engage in safety planning including plan to return to [facility] or contact emergency services if he feels unable to maintain his own safety or  the safety of others.      Cydney Draft, NP 06/29/2023, 6:35 PM

## 2023-06-29 NOTE — ED Notes (Signed)
 Patient resting quietly in bed with eyes closed. Unlabored breathing. NAD noted. Facility protocol safety checks in place. Pt is currently safe on the unit.

## 2023-06-29 NOTE — ED Notes (Signed)
 Pt observed lying in bed. Eyes closed respirations even and non labored. NAD Hourly observations continue for safety.

## 2023-06-29 NOTE — ED Notes (Signed)
 Stable. A&O x 4.  Discharging to home/self care.   Denies current SI plan and Intent.  Denies HI and A/V hallucinations.   All belongings and valuables returned to patient  F/u and discharge instructions reviewed with pt.  Pt verbalized understanding

## 2023-07-01 LAB — CULTURE, BLOOD (ROUTINE X 2)
Culture: NO GROWTH
Culture: NO GROWTH

## 2023-07-29 ENCOUNTER — Ambulatory Visit: Admitting: Family Medicine

## 2023-10-23 ENCOUNTER — Ambulatory Visit: Admitting: Family Medicine

## 2023-12-10 ENCOUNTER — Emergency Department (HOSPITAL_COMMUNITY)
Admission: EM | Admit: 2023-12-10 | Discharge: 2023-12-11 | Discharge status: Against Medical Advice | Attending: Emergency Medicine | Admitting: Emergency Medicine

## 2023-12-10 ENCOUNTER — Emergency Department (HOSPITAL_COMMUNITY)

## 2023-12-10 ENCOUNTER — Encounter (HOSPITAL_COMMUNITY): Payer: Self-pay

## 2023-12-10 DIAGNOSIS — R079 Chest pain, unspecified: Secondary | ICD-10-CM | POA: Diagnosis not present

## 2023-12-10 DIAGNOSIS — R0602 Shortness of breath: Secondary | ICD-10-CM | POA: Insufficient documentation

## 2023-12-10 DIAGNOSIS — Z5321 Procedure and treatment not carried out due to patient leaving prior to being seen by health care provider: Secondary | ICD-10-CM | POA: Insufficient documentation

## 2023-12-10 DIAGNOSIS — R059 Cough, unspecified: Secondary | ICD-10-CM | POA: Diagnosis not present

## 2023-12-10 DIAGNOSIS — J45909 Unspecified asthma, uncomplicated: Secondary | ICD-10-CM | POA: Insufficient documentation

## 2023-12-10 LAB — BASIC METABOLIC PANEL WITH GFR
Anion gap: 12 (ref 5–15)
BUN: 7 mg/dL (ref 6–20)
CO2: 21 mmol/L — ABNORMAL LOW (ref 22–32)
Calcium: 8.5 mg/dL — ABNORMAL LOW (ref 8.9–10.3)
Chloride: 102 mmol/L (ref 98–111)
Creatinine, Ser: 0.52 mg/dL (ref 0.44–1.00)
GFR, Estimated: >60 mL/min
Glucose, Bld: 124 mg/dL — ABNORMAL HIGH (ref 70–99)
Potassium: 3.5 mmol/L (ref 3.5–5.1)
Sodium: 135 mmol/L (ref 135–145)

## 2023-12-10 LAB — CBC WITH DIFFERENTIAL/PLATELET
Abs Immature Granulocytes: 0.03 K/uL (ref 0.00–0.07)
Basophils Absolute: 0 K/uL (ref 0.0–0.1)
Basophils Relative: 0 %
Eosinophils Absolute: 0.5 K/uL (ref 0.0–0.5)
Eosinophils Relative: 5 %
HCT: 40.1 % (ref 36.0–46.0)
Hemoglobin: 12.7 g/dL (ref 12.0–15.0)
Immature Granulocytes: 0 %
Lymphocytes Relative: 16 %
Lymphs Abs: 1.7 K/uL (ref 0.7–4.0)
MCH: 25.3 pg — ABNORMAL LOW (ref 26.0–34.0)
MCHC: 31.7 g/dL (ref 30.0–36.0)
MCV: 80 fL (ref 80.0–100.0)
Monocytes Absolute: 0.8 K/uL (ref 0.1–1.0)
Monocytes Relative: 8 %
Neutro Abs: 7.2 K/uL (ref 1.7–7.7)
Neutrophils Relative %: 71 %
Platelets: 322 K/uL (ref 150–400)
RBC: 5.01 MIL/uL (ref 3.87–5.11)
RDW: 14.6 % (ref 11.5–15.5)
WBC: 10.1 K/uL (ref 4.0–10.5)
nRBC: 0 % (ref 0.0–0.2)

## 2023-12-10 LAB — RESP PANEL BY RT-PCR (RSV, FLU A&B, COVID)  RVPGX2
Influenza A by PCR: NEGATIVE
Influenza B by PCR: NEGATIVE
Resp Syncytial Virus by PCR: NEGATIVE
SARS Coronavirus 2 by RT PCR: NEGATIVE

## 2023-12-10 LAB — TROPONIN I (HIGH SENSITIVITY)
Troponin I (High Sensitivity): 3 ng/L (ref <18)
Troponin I (High Sensitivity): 5 ng/L (ref <18)

## 2023-12-10 MED ORDER — ALBUTEROL SULFATE HFA 108 (90 BASE) MCG/ACT IN AERS
2.0000 | INHALATION_SPRAY | Freq: Once | RESPIRATORY_TRACT | Status: AC
Start: 2023-12-10 — End: 2023-12-10
  Administered 2023-12-10: 2 via RESPIRATORY_TRACT
  Filled 2023-12-10: qty 6.7

## 2023-12-10 NOTE — ED Provider Triage Note (Signed)
 Emergency Medicine Provider Triage Evaluation Note  Holly Kemp , a 43 y.o. female  was evaluated in triage.  Pt complains of shortness of breath, cough.  Chest pain x 1 day.  History of asthma.  Patient satting 92% on room air  Review of Systems  Positive: Cough, shortness of breath, chest pain Negative: Nausea, vomiting, fever, chills  Physical Exam  BP (!) 160/100 (BP Location: Right Arm)   Pulse (!) 117   Temp 98.3 F (36.8 C) (Oral)   Resp (!) 24   Ht 5' 8 (1.727 m)   Wt 98.9 kg   LMP 12/10/2023   SpO2 94% Comment: Rechecked by PA  BMI 33.15 kg/m  Gen:   Awake, no distress Resp:  Increased work of breathing, upper airway noises, lungs CTAB MSK:   Moves extremities without difficulty  Other:  Patient sounds congested  Medical Decision Making  Medically screening exam initiated at 3:18 PM.  Appropriate orders placed.  Holly Kemp was informed that the remainder of the evaluation will be completed by another provider, this initial triage assessment does not replace that evaluation, and the importance of remaining in the ED until their evaluation is complete.  Labs and imaging ordered   Holly Ileana SAILOR, PA-C 12/10/23 [id number]

## 2023-12-10 NOTE — ED Notes (Signed)
Placed on 2 lpm via NC.

## 2023-12-10 NOTE — ED Triage Notes (Addendum)
 Pt to ED c/o [facility] and chest pain X 1 day, cough x 2 weeks, hx asthma. No alleviating or aggravating factors. Audible wheezing,. Room air sat 91-92%, HR 120S, RR 24-26.

## 2023-12-11 NOTE — ED Notes (Signed)
 Called pt 3x via phone call, and patient did not answer.

## 2023-12-28 ENCOUNTER — Emergency Department (HOSPITAL_COMMUNITY)
Admission: EM | Admit: 2023-12-28 | Discharge: 2023-12-28 | Disposition: A | Discharge status: Home / Self Care | Attending: Emergency Medicine | Admitting: Emergency Medicine

## 2023-12-28 ENCOUNTER — Encounter (HOSPITAL_COMMUNITY): Payer: Self-pay

## 2023-12-28 ENCOUNTER — Other Ambulatory Visit: Payer: Self-pay

## 2023-12-28 ENCOUNTER — Emergency Department (HOSPITAL_COMMUNITY)

## 2023-12-28 DIAGNOSIS — J4541 Moderate persistent asthma with (acute) exacerbation: Secondary | ICD-10-CM

## 2023-12-28 LAB — HCG, SERUM, QUALITATIVE: Preg, Serum: NEGATIVE

## 2023-12-28 LAB — D-DIMER, QUANTITATIVE: D-Dimer, Quant: 0.53 ug{FEU}/mL — ABNORMAL HIGH (ref 0.00–0.50)

## 2023-12-28 LAB — CBC
HCT: 41.4 % (ref 36.0–46.0)
Hemoglobin: 13.2 g/dL (ref 12.0–15.0)
MCH: 25.5 pg — ABNORMAL LOW (ref 26.0–34.0)
MCHC: 31.9 g/dL (ref 30.0–36.0)
MCV: 79.9 fL — ABNORMAL LOW (ref 80.0–100.0)
Platelets: 328 K/uL (ref 150–400)
RBC: 5.18 MIL/uL — ABNORMAL HIGH (ref 3.87–5.11)
RDW: 14.3 % (ref 11.5–15.5)
WBC: 9.2 K/uL (ref 4.0–10.5)
nRBC: 0 % (ref 0.0–0.2)

## 2023-12-28 LAB — BASIC METABOLIC PANEL WITH GFR
Anion gap: 7 (ref 5–15)
BUN: 10 mg/dL (ref 6–20)
CO2: 27 mmol/L (ref 22–32)
Calcium: 8.6 mg/dL — ABNORMAL LOW (ref 8.9–10.3)
Chloride: 103 mmol/L (ref 98–111)
Creatinine, Ser: 0.89 mg/dL (ref 0.44–1.00)
GFR, Estimated: >60 mL/min (ref >60)
Glucose, Bld: 141 mg/dL — ABNORMAL HIGH (ref 70–99)
Potassium: 3.3 mmol/L — ABNORMAL LOW (ref 3.5–5.1)
Sodium: 137 mmol/L (ref 135–145)

## 2023-12-28 LAB — BRAIN NATRIURETIC PEPTIDE: B Natriuretic Peptide: 11 pg/mL (ref 0.0–100.0)

## 2023-12-28 LAB — RESP PANEL BY RT-PCR (RSV, FLU A&B, COVID)  RVPGX2
Influenza A by PCR: NEGATIVE
Influenza B by PCR: NEGATIVE
Resp Syncytial Virus by PCR: NEGATIVE
SARS Coronavirus 2 by RT PCR: NEGATIVE

## 2023-12-28 MED ORDER — ALBUTEROL SULFATE (2.5 MG/3ML) 0.083% IN NEBU
10.0000 mg/h | INHALATION_SOLUTION | Freq: Once | RESPIRATORY_TRACT | Status: AC
  Administered 2023-12-28: 10 mg/h via RESPIRATORY_TRACT
  Filled 2023-12-28: qty 12

## 2023-12-28 MED ORDER — PREDNISONE 50 MG PO TABS: ORAL_TABLET | ORAL | 0 refills | Status: DC

## 2023-12-28 MED ORDER — METHYLPREDNISOLONE SODIUM SUCC 125 MG IJ SOLR
125.0000 mg | Freq: Once | INTRAMUSCULAR | Status: AC
  Administered 2023-12-28: 125 mg via INTRAVENOUS
  Filled 2023-12-28: qty 2

## 2023-12-28 MED ORDER — ALBUTEROL SULFATE HFA 108 (90 BASE) MCG/ACT IN AERS: 2.0000 | INHALATION_SPRAY | RESPIRATORY_TRACT | 0 refills | Status: DC | PRN

## 2023-12-28 MED ORDER — IOHEXOL 350 MG/ML SOLN
75.0000 mL | Freq: Once | INTRAVENOUS | Status: AC | PRN
  Administered 2023-12-28: 75 mL via INTRAVENOUS

## 2023-12-28 NOTE — ED Triage Notes (Signed)
 Pt arrives with c/o SOB that started tonight. Per pt, she was at home and had a coughing fit and then the SOB started. Pt has hx of asthma. Pt attempted to use rescue inhaler without relief. Pt denies CP.

## 2023-12-28 NOTE — ED Notes (Signed)
 Patient transported to CT

## 2023-12-28 NOTE — ED Provider Notes (Signed)
 [place] EMERGENCY DEPARTMENT AT [facility] Provider Note   CSN: [id number] Arrival date & time: 12/28/23  0104     Patient presents with: Shortness of Breath  Level 5 caveat due to acuity of condition Holly Kemp is a 43 y.o. female.   The history is provided by the patient. The history is limited by the condition of the patient.  Patient with previous history of psychosis, reported history of asthma, GERD presents with shortness of breath.  Patient reports she was in her usual baseline until several hours ago when she had abrupt onset of cough wheezing shortness of breath.  She feels that it is an asthma attack.  She has used albuterol  without relief.  No fevers or vomiting.  No chest pain.  Patient reports similar episode last month that resolved.    Past Medical History:  Diagnosis Date   GERD (gastroesophageal reflux disease)     Prior to Admission medications   Medication Sig Start Date End Date Taking? Authorizing Provider  albuterol  (VENTOLIN  HFA) 108 (90 Base) MCG/ACT inhaler Inhale 2 puffs into the lungs every 4 (four) hours as needed for wheezing or shortness of breath. 12/28/23  Yes Midge Golas, MD  predniSONE  (DELTASONE ) 50 MG tablet 1 tablet PO QD X4 days 12/28/23  Yes Midge Golas, MD    Allergies: Patient has no known allergies.    Review of Systems  Unable to perform ROS: Acuity of condition    Updated Vital Signs BP 121/83   Pulse (!) 113   Temp 97.8 F (36.6 C)   Resp 15   Ht 1.727 m (5' 8)   Wt 98.9 kg   LMP 12/10/2023   SpO2 97%   BMI 33.15 kg/m   Physical Exam CONSTITUTIONAL: Ill-appearing HEAD: Normocephalic/atraumatic EYES: EOMI/PERRL ENMT: Mucous membranes moist, no stridor or drooling NECK: supple no meningeal signs, no JVD CV: S1/S2 noted, tachycardic LUNGS: Tachypneic, decreased breath sounds bilaterally, scattered wheezing noted ABDOMEN: soft, nontender NEURO: Pt is awake/alert/appropriate, moves all  extremitiesx4.  No facial droop.   EXTREMITIES: pulses normal/equal, full ROM, no lower extremity edema noted SKIN: warm, color normal PSYCH: Anxious  (all labs ordered are listed, but only abnormal results are displayed) Labs Reviewed  CBC - Abnormal; Notable for the following components:      Result Value   RBC 5.18 (*)    MCV 79.9 (*)    MCH 25.5 (*)    All other components within normal limits  BASIC METABOLIC PANEL WITH GFR - Abnormal; Notable for the following components:   Potassium 3.3 (*)    Glucose, Bld 141 (*)    Calcium 8.6 (*)    All other components within normal limits  D-DIMER, QUANTITATIVE - Abnormal; Notable for the following components:   D-Dimer, Quant 0.53 (*)    All other components within normal limits  RESP PANEL BY RT-PCR (RSV, FLU A&B, COVID)  RVPGX2  BRAIN NATRIURETIC PEPTIDE  HCG, SERUM, QUALITATIVE    EKG: EKG Interpretation Date/Time:  Monday December 28 2023 01:15:22 EST Ventricular Rate:  161 PR Interval:  100 QRS Duration:  93 QT Interval:  327 QTC Calculation: 536 R Axis:   61  Text Interpretation: Sinus tachycardia Borderline repolarization abnormality Prolonged QT interval Abnormal ekg Confirmed by Midge Golas ([id number]) on 12/28/2023 1:20:34 AM  Radiology: CT Angio Chest PE W and/or Wo Contrast Result Date: 12/28/2023 EXAM: CTA of the Chest with contrast for PE 12/28/2023 03:18:08 AM TECHNIQUE: CTA of the chest  was performed without and with the administration of 75 mL of intravenous iohexol  (OMNIPAQUE ) 350 MG/ML injection. Multiplanar reformatted images are provided for review. MIP images are provided for review. Automated exposure control, iterative reconstruction, and/or weight based adjustment of the mA/kV was utilized to reduce the radiation dose to as low as reasonably achievable. COMPARISON: None available. CLINICAL HISTORY: Pulmonary embolism (PE) suspected, low to intermediate prob, positive D-dimer. FINDINGS: PULMONARY ARTERIES:  Pulmonary arteries are adequately opacified for evaluation. Limited subsegmental pulmonary embolus due to timing of contrast. Main pulmonary artery is normal in caliber. MEDIASTINUM: The heart and pericardium demonstrate no acute abnormality. There is no acute abnormality of the thoracic aorta. LYMPH NODES: No mediastinal, hilar or axillary lymphadenopathy. LUNGS AND PLEURA: Right middle lobe micronodule (7:70). No focal consolidation or pulmonary edema. No pleural effusion or pneumothorax. UPPER ABDOMEN: Limited images of the upper abdomen are unremarkable. SOFT TISSUES AND BONES: Subcentimeter density of the left thyroid  gland. No acute bone abnormality. IMPRESSION: 1. No pulmonary embolus. 2. No acute findings. Electronically signed by: Morgane Naveau MD 12/28/2023 03:35 AM EST RP Workstation: [id number]   DG Chest Port 1 View Result Date: 12/28/2023 EXAM: 1 VIEW(S) XRAY OF THE CHEST 12/28/2023 01:28:00 AM COMPARISON: Chest x-ray 11/2023, CT chest 01/06/2020. CLINICAL HISTORY: sob FINDINGS: LUNGS AND PLEURA: No focal pulmonary opacity. No pleural effusion. No pneumothorax. HEART AND MEDIASTINUM: No acute abnormality of the cardiac and mediastinal silhouettes. BONES AND SOFT TISSUES: No acute osseous abnormality. IMPRESSION: 1. No acute cardiopulmonary process. Electronically signed by: Morgane Naveau MD 12/28/2023 01:31 AM EST RP Workstation: [id number]     .Critical Care  Performed by: Midge Golas, MD Authorized by: Midge Golas, MD   Critical care provider statement:    Critical care time (minutes):  45   Critical care start time:  12/28/2023 1:30 AM   Critical care end time:  12/28/2023 2:15 AM   Critical care time was exclusive of:  Separately billable procedures and treating other patients   Critical care was necessary to treat or prevent imminent or life-threatening deterioration of the following conditions:  Respiratory failure   Critical care was time spent personally by me on the  following activities:  Obtaining history from patient or surrogate, examination of patient, evaluation of patient's response to treatment, development of treatment plan with patient or surrogate, ordering and review of laboratory studies, ordering and performing treatments and interventions, ordering and review of radiographic studies, pulse oximetry, re-evaluation of patient's condition and review of old charts   I assumed direction of critical care for this patient from another provider in my specialty: no      Medications Ordered in the ED  albuterol  (PROVENTIL ) (2.5 MG/3ML) 0.083% nebulizer solution (10 mg/hr Nebulization Given 12/28/23 0132)  methylPREDNISolone  sodium succinate (SOLU-MEDROL ) 125 mg/2 mL injection 125 mg (125 mg Intravenous Given 12/28/23 0133)  iohexol  (OMNIPAQUE ) 350 MG/ML injection 75 mL (75 mLs Intravenous Contrast Given 12/28/23 0318)    Clinical Course as of 12/28/23 0428  Mon Dec 28, 2023  0229 Glucose(!): 141 Mild hyperglycemia [DW]  0314 Patient presented with abrupt onset of shortness of breath.  She was tachycardic and tachypneic.  She had already received albuterol  prior to arrival.  She has been given continuous treatment here with some improvement in her respiratory status however she is still tachycardic.  Her lung sounds are now clear.  D-dimer is elevated, will obtain CT chest [DW]  [phone] Patient appears to be resting comfortably.  CT imaging pending [DW]  [phone]  Patient appears dramatically improved. Her heart rate is now in the 115's Her lung sounds are clear. Her CT chest was negative [DW]  [phone] Patient reports she has had increasing cough, tonight she did feel that the cold air triggered this episode She has never been admitted for asthma before.  She is a non-smoker. She denied any other symptoms. [DW]  [phone] Patient is dramatically improved, will discharge home.  I have prescribed a new albuterol  inhaler as well as prednisone  for an additional 4 days.  We  also discussed the need to establish with a PCP [DW]    Clinical Course User Index [DW] Midge Golas, MD                                 Medical Decision Making Amount and/or Complexity of Data Reviewed Labs: ordered. Decision-making details documented in ED Course. Radiology: ordered.  Risk Prescription drug management.   This patient presents to the ED for concern of shortness of breath, this involves an extensive number of treatment options, and is a complaint that carries with it a high risk of complications and morbidity.  The differential diagnosis includes but is not limited to Acute coronary syndrome, pneumonia, acute pulmonary edema, pneumothorax, acute anemia, pulmonary embolism, asthma exacerbation   Comorbidities that complicate the patient evaluation: Patient's presentation is complicated by their history of asthma, previous history of psychosis  Social Determinants of Health: Patient's impaired access to primary care  increases the complexity of managing their presentation  Additional history obtained: Records reviewed previous behavioral health records reviewed  Lab Tests: I Ordered, and personally interpreted labs.  The pertinent results include: Mild hyperglycemia  Imaging Studies ordered: I ordered imaging studies including CT scan chest  I independently visualized and interpreted imaging which showed no acute findings I agree with the radiologist interpretation  Cardiac Monitoring: The patient was maintained on a cardiac monitor.  I personally viewed and interpreted the cardiac monitor which showed an underlying rhythm of:  sinus tachycardia  Medicines ordered and prescription drug management: I ordered medication including albuterol  therapies for shortness of breath Reevaluation of the patient after these medicines showed that the patient    improved  Critical Interventions:  solu- Medrol  and albuterol    Reevaluation: After the interventions  noted above, I reevaluated the patient and found that they have :improved  Complexity of problems addressed: Patient's presentation is most consistent with  acute presentation with potential threat to life or bodily function  Disposition: After consideration of the diagnostic results and the patient's response to treatment,  I feel that the patent would benefit from discharge  .        Final diagnoses:  Moderate persistent asthma with exacerbation    ED Discharge Orders          Ordered    predniSONE  (DELTASONE ) 50 MG tablet        12/28/23 0405    albuterol  (VENTOLIN  HFA) 108 (90 Base) MCG/ACT inhaler  Every 4 hours PRN        12/28/23 0405               Midge Golas, MD 12/28/23 0430

## 2024-02-21 ENCOUNTER — Emergency Department (HOSPITAL_COMMUNITY)
Admission: EM | Admit: 2024-02-21 | Discharge: 2024-02-21 | Disposition: A | Discharge status: Home / Self Care | Attending: Emergency Medicine | Admitting: Emergency Medicine

## 2024-02-21 ENCOUNTER — Emergency Department (HOSPITAL_COMMUNITY)

## 2024-02-21 ENCOUNTER — Encounter (HOSPITAL_COMMUNITY): Payer: Self-pay | Admitting: *Deleted

## 2024-02-21 ENCOUNTER — Other Ambulatory Visit: Payer: Self-pay

## 2024-02-21 DIAGNOSIS — R059 Cough, unspecified: Secondary | ICD-10-CM | POA: Insufficient documentation

## 2024-02-21 DIAGNOSIS — R0602 Shortness of breath: Secondary | ICD-10-CM | POA: Diagnosis present

## 2024-02-21 DIAGNOSIS — J45901 Unspecified asthma with (acute) exacerbation: Secondary | ICD-10-CM | POA: Diagnosis not present

## 2024-02-21 HISTORY — DX: Unspecified asthma, uncomplicated: J45.909

## 2024-02-21 LAB — CBC WITH DIFFERENTIAL/PLATELET
Abs Immature Granulocytes: 0.04 10*3/uL (ref 0.00–0.07)
Basophils Absolute: 0 10*3/uL (ref 0.0–0.1)
Basophils Relative: 0 %
Eosinophils Absolute: 0.3 10*3/uL (ref 0.0–0.5)
Eosinophils Relative: 2 %
HCT: 42.3 % (ref 36.0–46.0)
Hemoglobin: 13.6 g/dL (ref 12.0–15.0)
Immature Granulocytes: 0 %
Lymphocytes Relative: 10 %
Lymphs Abs: 1.3 10*3/uL (ref 0.7–4.0)
MCH: 25.7 pg — ABNORMAL LOW (ref 26.0–34.0)
MCHC: 32.2 g/dL (ref 30.0–36.0)
MCV: 80 fL (ref 80.0–100.0)
Monocytes Absolute: 0.8 10*3/uL (ref 0.1–1.0)
Monocytes Relative: 6 %
Neutro Abs: 10.3 10*3/uL — ABNORMAL HIGH (ref 1.7–7.7)
Neutrophils Relative %: 82 %
Platelets: 250 10*3/uL (ref 150–400)
RBC: 5.29 MIL/uL — ABNORMAL HIGH (ref 3.87–5.11)
RDW: 14 % (ref 11.5–15.5)
WBC: 12.7 10*3/uL — ABNORMAL HIGH (ref 4.0–10.5)
nRBC: 0 % (ref 0.0–0.2)

## 2024-02-21 LAB — BASIC METABOLIC PANEL WITH GFR
Anion gap: 12 (ref 5–15)
BUN: 11 mg/dL (ref 6–20)
CO2: 23 mmol/L (ref 22–32)
Calcium: 9.3 mg/dL (ref 8.9–10.3)
Chloride: 103 mmol/L (ref 98–111)
Creatinine, Ser: 0.84 mg/dL (ref 0.44–1.00)
GFR, Estimated: >60 mL/min
Glucose, Bld: 96 mg/dL (ref 70–99)
Potassium: 3.6 mmol/L (ref 3.5–5.1)
Sodium: 138 mmol/L (ref 135–145)

## 2024-02-21 LAB — RESP PANEL BY RT-PCR (RSV, FLU A&B, COVID)  RVPGX2
Influenza A by PCR: NEGATIVE
Influenza B by PCR: NEGATIVE
Resp Syncytial Virus by PCR: NEGATIVE
SARS Coronavirus 2 by RT PCR: NEGATIVE

## 2024-02-21 LAB — HCG, SERUM, QUALITATIVE: Preg, Serum: NEGATIVE

## 2024-02-21 LAB — MAGNESIUM: Magnesium: 1.9 mg/dL (ref 1.7–2.4)

## 2024-02-21 MED ORDER — IPRATROPIUM-ALBUTEROL 0.5-2.5 (3) MG/3ML IN SOLN
3.0000 mL | Freq: Once | RESPIRATORY_TRACT | Status: AC
  Administered 2024-02-21: 3 mL via RESPIRATORY_TRACT
  Filled 2024-02-21: qty 3

## 2024-02-21 MED ORDER — SODIUM CHLORIDE 0.9 % IV BOLUS
1000.0000 mL | Freq: Once | INTRAVENOUS | Status: AC
  Administered 2024-02-21: 1000 mL via INTRAVENOUS

## 2024-02-21 MED ORDER — METHYLPREDNISOLONE SODIUM SUCC 125 MG IJ SOLR
125.0000 mg | Freq: Once | INTRAMUSCULAR | Status: AC
  Administered 2024-02-21: 125 mg via INTRAVENOUS
  Filled 2024-02-21: qty 2

## 2024-02-21 MED ORDER — PREDNISONE 10 MG PO TABS: 40.0000 mg | ORAL_TABLET | Freq: Every day | ORAL | 0 refills | Status: AC

## 2024-02-21 MED ORDER — ALBUTEROL SULFATE HFA 108 (90 BASE) MCG/ACT IN AERS: 1.0000 | INHALATION_SPRAY | Freq: Four times a day (QID) | RESPIRATORY_TRACT | 0 refills | Status: AC | PRN

## 2024-02-21 NOTE — ED Triage Notes (Addendum)
 Pt here via [facility] from home for sob since yesterday.  Hx of asthma. Per fire, exp wheezing.  5/.5 alb/atrovent.  Pt used inhaler x 3 prior to calling EMS.  Pt c/o runny nose x 2 days and headache tx with Anast[facility] Poweders this am.   VS  97% after nebx Bp 150/100 Hr 120 stach Rr 14-28

## 2024-02-21 NOTE — ED Provider Notes (Signed)
 " [facility] Provider Note   CSN: [id number] Arrival date & time: 02/12/24  0836    HPI 44 yo female with h/o asthma, psychosis, GERD presents with SOB. Started with sob and wheezing last night, dry cough, tried albuterol  without relief. Also feels nasal congestion and runny nose. Tried benadryl  last night as well. Feels like asthma attack. No prior intubations.   Current Outpatient Medications  Medication Instructions   albuterol  (VENTOLIN  HFA) 108 (90 Base) MCG/ACT inhaler 1-2 puffs, Inhalation, Every 6 hours PRN   predniSONE  (DELTASONE ) 40 mg, Oral, Daily     Allergies[1]   Review of Systems  Cough and sob  Updated Vital Signs BP (!) 139/101   Pulse 97   Temp 99.3 F (37.4 C) (Oral)   Resp 18   LMP 02/07/2024   SpO2 99%   Physical Exam  Constitutional: Patient appears well-developed and well-nourished. No distress.  HENT:  Head: Normocephalic and atraumatic.  Mouth/Throat: Oropharynx is clear and moist. No oropharyngeal exudate.  Eyes: Conjunctivae are normal. Pupils are equal, round, and reactive to light. Neck: Normal range of motion. Neck supple.  Cardiovascular: Tachycardia, regular rhythm, normal heart sounds and intact distal pulses.   Pulmonary/Chest: Effort normal and breath sounds normal. Diffuse wheezing with good air movement.  Abdominal: Soft. Bowel sounds are normal. No distension or tenderness.  Musculoskeletal: Normal range of motion. No edema or tenderness.  Neurological: Patient is alert and oriented.  No facial droop.  Clear speech.  Normal strength and sensation throughout.  Normal coordination. Skin: Skin is warm and dry. No diaphoresis.  Psychiatric: Normal mood and affect. Normal behavior. Judgment and thought content normal.  Nursing note and vitals reviewed.    Results for orders placed or performed during the hospital encounter of 02/21/24  Resp panel by RT-PCR (RSV, Flu A&B, Covid) Anterior Nasal  Swab   Collection Time: 02/21/24  4:10 PM   Specimen: Anterior Nasal Swab  Result Value Ref Range   SARS Coronavirus 2 by RT PCR NEGATIVE NEGATIVE   Influenza A by PCR NEGATIVE NEGATIVE   Influenza B by PCR NEGATIVE NEGATIVE   Resp Syncytial Virus by PCR NEGATIVE NEGATIVE  Basic metabolic panel   Collection Time: 02/21/24  4:10 PM  Result Value Ref Range   Sodium 138 135 - 145 mmol/L   Potassium 3.6 3.5 - 5.1 mmol/L   Chloride 103 98 - 111 mmol/L   CO2 23 22 - 32 mmol/L   Glucose, Bld 96 70 - 99 mg/dL   BUN 11 6 - 20 mg/dL   Creatinine, Ser 9.15 0.44 - 1.00 mg/dL   Calcium 9.3 8.9 - 89.6 mg/dL   GFR, Estimated >39 >39 mL/min   Anion gap 12 5 - 15  CBC with Differential   Collection Time: 02/21/24  4:10 PM  Result Value Ref Range   WBC 12.7 (H) 4.0 - 10.5 K/uL   RBC 5.29 (H) 3.87 - 5.11 MIL/uL   Hemoglobin 13.6 12.0 - 15.0 g/dL   HCT 57.6 63.9 - 53.9 %   MCV 80.0 80.0 - 100.0 fL   MCH 25.7 (L) 26.0 - 34.0 pg   MCHC 32.2 30.0 - 36.0 g/dL   RDW 85.9 88.4 - 84.4 %   Platelets 250 150 - 400 K/uL   nRBC 0.0 0.0 - 0.2 %   Neutrophils Relative % 82 %   Neutro Abs 10.3 (H) 1.7 - 7.7 K/uL   Lymphocytes Relative 10 %  Lymphs Abs 1.3 0.7 - 4.0 K/uL   Monocytes Relative 6 %   Monocytes Absolute 0.8 0.1 - 1.0 K/uL   Eosinophils Relative 2 %   Eosinophils Absolute 0.3 0.0 - 0.5 K/uL   Basophils Relative 0 %   Basophils Absolute 0.0 0.0 - 0.1 K/uL   Immature Granulocytes 0 %   Abs Immature Granulocytes 0.04 0.00 - 0.07 K/uL  Magnesium    Collection Time: 02/21/24  4:10 PM  Result Value Ref Range   Magnesium  1.9 1.7 - 2.4 mg/dL  hCG, serum, qualitative   Collection Time: 02/21/24  4:10 PM  Result Value Ref Range   Preg, Serum NEGATIVE NEGATIVE   DG Chest 1 View Result Date: 02/21/2024 CLINICAL DATA:  Shortness of breath since yesterday, wheezing EXAM: CHEST  1 VIEW COMPARISON:  12/28/2023 FINDINGS: Single frontal view of the chest was obtained, excluding portions of the right  lateral chest by collimation. Cardiac silhouette is unremarkable. No acute airspace disease, effusion, or pneumothorax. No acute bony abnormalities. IMPRESSION: 1. No acute intrathoracic process. Electronically Signed   By: Ozell Daring M.D.   On: 02/21/2024 16:07      EKG Interpretation Date/Time:  Sunday February 21 2024 15:00:17 EST Ventricular Rate:  126 PR Interval:  141 QRS Duration:  90 QT Interval:  335 QTC Calculation: 485 R Axis:   64  Text Interpretation: Sinus tachycardia Confirmed by Jaicee Michelotti [phone]) on 02/21/2024 7:11:06 PM         Medications Ordered in the ED  ipratropium-albuterol  (DUONEB) 0.5-2.5 (3) MG/3ML nebulizer solution 3 mL (3 mLs Nebulization Given 02/21/24 1553)  sodium chloride  0.9 % bolus 1,000 mL (1,000 mLs Intravenous New Bag/Given 02/21/24 1604)  methylPREDNISolone  sodium succinate (SOLU-MEDROL ) 125 mg/2 mL injection 125 mg (125 mg Intravenous Given 02/21/24 1605)                                     This patient presents to the ED with chief complaint(s) of sob with pertinent past medical history of asthma . The complaint involves an extensive differential diagnosis and also carries with it a high risk of complications and morbidity.    Additional history obtained from none. I have also reviewed previous ED records  The differential diagnosis includes asthma exacerbation, pneumonia, viral infection  The initial management included bronchodilators, steroids, labs, viral swabs, chest x-ray    Reassessments:  The following labs were independently interpreted: Mild leukocytosis with white count 12.7.  Viral swabs negative.  I independently visualized the following imaging with scope of interpretation limited to determining acute life threatening conditions related to emergency care: Chest x-ray, which revealed negative  Treatment and Reassessment: Feeling much better after steroids, albuterol .  Heart rate now in 90s.  Lungs are clear.  Oxygen is  100% on room air.  Ambulated and maintained oxygen at 97% or higher.  Patient has a dry cough and is a non-smoker with no fevers and clear chest x-ray so despite leukocytosis which might be somewhat chronic for her I do not think she warrants antibiotic treatment at this time.  Suspect asthma exacerbation and will treat with steroids and bronchodilators.  Consultation: - Consulted or discussed management/test interpretation with external professional: None  Consideration for admission or further workup: Not indicated  Social Determinants of health: None    ICD-10-CM   1. Moderate asthma with exacerbation, unspecified whether persistent  J45.901  ED Discharge Orders          Ordered    predniSONE  (DELTASONE ) 10 MG tablet  Daily        02/21/24 1932    albuterol  (VENTOLIN  HFA) 108 (90 Base) MCG/ACT inhaler  Every 6 hours PRN        02/21/24 1932                [1] No Known Allergies    Sanora Cunanan Hima, MD 02/21/24 1958  "

## 2024-02-21 NOTE — ED Notes (Signed)
 Pt discharged per provider order. All discharge instructions were discussed with pt and understanding verbalized. At time of DC pt is alert, ambulatory, and able to make needs known. Pt voices no concerns at this time. Pt took all personal belongings home.

## 2024-02-21 NOTE — Discharge Instructions (Signed)
 You were seen in the emergency department for shortness of breath.  You had an asthma exacerbation.  I am prescribing prednisone  and albuterol  for you.  If you worsen in any way, please be reevaluated immediately.  Please see your primary care doctor in the next week for recheck.

## 2024-02-21 NOTE — ED Provider Notes (Signed)
 duplicate   Fredia Rosette Kirsch, MD 02/21/24 2235

## 2024-02-22 ENCOUNTER — Telehealth: Payer: Self-pay

## 2024-02-22 NOTE — Telephone Encounter (Signed)
 Copied from CRM #[id number]. Topic: Appointments - Scheduling Inquiry for Clinic >> Feb 22, 2024  4:49 PM Rea ORN wrote: Reason for CRM: Pt called to advise that she was seen at ED 02/21/24 and was advised to be seen for ED Follow Up. Pt stated she has had 2 ED visits within the past 2 months for Asthma related complications. She has an establishing care appt on 4/28 and is asking she can be seen sooner. Pt was added to the cancellation list.  Please call back to advise,  [phone]

## 2024-02-23 NOTE — Telephone Encounter (Signed)
 LVM for patient

## 2024-03-11 ENCOUNTER — Ambulatory Visit: Admitting: Family Medicine

## 2024-05-17 ENCOUNTER — Ambulatory Visit: Admitting: Family Medicine
